# Patient Record
Sex: Male | Born: 1945
Health system: Southern US, Community
[De-identification: ages and names within clinical notes are randomized; demographics above are authoritative.]

## PROBLEM LIST (undated history)

## (undated) DIAGNOSIS — M1990 Unspecified osteoarthritis, unspecified site: Secondary | ICD-10-CM

## (undated) DIAGNOSIS — R0600 Dyspnea, unspecified: Secondary | ICD-10-CM

## (undated) DIAGNOSIS — I4891 Unspecified atrial fibrillation: Secondary | ICD-10-CM

## (undated) DIAGNOSIS — I509 Heart failure, unspecified: Secondary | ICD-10-CM

## (undated) DIAGNOSIS — M545 Low back pain, unspecified: Secondary | ICD-10-CM

## (undated) DIAGNOSIS — J449 Chronic obstructive pulmonary disease, unspecified: Secondary | ICD-10-CM

## (undated) DIAGNOSIS — G709 Myoneural disorder, unspecified: Secondary | ICD-10-CM

## (undated) DIAGNOSIS — I499 Cardiac arrhythmia, unspecified: Secondary | ICD-10-CM

## (undated) DIAGNOSIS — I2510 Atherosclerotic heart disease of native coronary artery without angina pectoris: Secondary | ICD-10-CM

## (undated) DIAGNOSIS — G4733 Obstructive sleep apnea (adult) (pediatric): Secondary | ICD-10-CM

## (undated) DIAGNOSIS — I10 Essential (primary) hypertension: Secondary | ICD-10-CM

## (undated) HISTORY — DX: Low back pain, unspecified: M54.50

## (undated) HISTORY — DX: Chronic obstructive pulmonary disease, unspecified: J44.9

## (undated) HISTORY — DX: Unspecified atrial fibrillation: I48.91

## (undated) HISTORY — DX: Essential (primary) hypertension: I10

## (undated) HISTORY — DX: Obstructive sleep apnea (adult) (pediatric): G47.33

## (undated) HISTORY — DX: Dyspnea, unspecified: R06.00

## (undated) HISTORY — PX: UMBILICAL HERNIA REPAIR: SHX196

## (undated) HISTORY — PX: OTHER SURGICAL HISTORY: SHX169

---
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
## 2015-09-04 DIAGNOSIS — J4520 Mild intermittent asthma, uncomplicated: Secondary | ICD-10-CM | POA: Diagnosis not present

## 2015-09-04 DIAGNOSIS — G4733 Obstructive sleep apnea (adult) (pediatric): Secondary | ICD-10-CM | POA: Diagnosis not present

## 2015-09-04 DIAGNOSIS — R5383 Other fatigue: Secondary | ICD-10-CM | POA: Diagnosis not present

## 2015-10-17 DIAGNOSIS — J4520 Mild intermittent asthma, uncomplicated: Secondary | ICD-10-CM | POA: Diagnosis not present

## 2015-10-17 DIAGNOSIS — G4733 Obstructive sleep apnea (adult) (pediatric): Secondary | ICD-10-CM | POA: Diagnosis not present

## 2015-10-17 DIAGNOSIS — R5383 Other fatigue: Secondary | ICD-10-CM | POA: Diagnosis not present

## 2015-10-18 DIAGNOSIS — G4733 Obstructive sleep apnea (adult) (pediatric): Secondary | ICD-10-CM | POA: Diagnosis not present

## 2015-10-22 DIAGNOSIS — D374 Neoplasm of uncertain behavior of colon: Secondary | ICD-10-CM | POA: Insufficient documentation

## 2015-10-26 DIAGNOSIS — G4733 Obstructive sleep apnea (adult) (pediatric): Secondary | ICD-10-CM | POA: Diagnosis not present

## 2015-10-26 DIAGNOSIS — R5383 Other fatigue: Secondary | ICD-10-CM | POA: Diagnosis not present

## 2015-10-26 DIAGNOSIS — J4520 Mild intermittent asthma, uncomplicated: Secondary | ICD-10-CM | POA: Diagnosis not present

## 2016-01-09 DIAGNOSIS — L918 Other hypertrophic disorders of the skin: Secondary | ICD-10-CM | POA: Diagnosis not present

## 2016-01-09 DIAGNOSIS — L02415 Cutaneous abscess of right lower limb: Secondary | ICD-10-CM | POA: Diagnosis not present

## 2017-01-28 DIAGNOSIS — D485 Neoplasm of uncertain behavior of skin: Secondary | ICD-10-CM | POA: Diagnosis not present

## 2017-01-28 DIAGNOSIS — L821 Other seborrheic keratosis: Secondary | ICD-10-CM | POA: Diagnosis not present

## 2017-01-28 DIAGNOSIS — L578 Other skin changes due to chronic exposure to nonionizing radiation: Secondary | ICD-10-CM | POA: Diagnosis not present

## 2017-02-05 DIAGNOSIS — R0602 Shortness of breath: Secondary | ICD-10-CM | POA: Diagnosis not present

## 2017-02-05 DIAGNOSIS — Z6837 Body mass index (BMI) 37.0-37.9, adult: Secondary | ICD-10-CM | POA: Diagnosis not present

## 2017-02-05 DIAGNOSIS — Z1389 Encounter for screening for other disorder: Secondary | ICD-10-CM | POA: Diagnosis not present

## 2017-02-05 DIAGNOSIS — E669 Obesity, unspecified: Secondary | ICD-10-CM | POA: Diagnosis not present

## 2017-02-05 DIAGNOSIS — I4891 Unspecified atrial fibrillation: Secondary | ICD-10-CM | POA: Diagnosis not present

## 2018-01-21 DIAGNOSIS — J449 Chronic obstructive pulmonary disease, unspecified: Secondary | ICD-10-CM | POA: Diagnosis not present

## 2018-01-21 DIAGNOSIS — H6121 Impacted cerumen, right ear: Secondary | ICD-10-CM | POA: Diagnosis not present

## 2018-01-21 DIAGNOSIS — Z6837 Body mass index (BMI) 37.0-37.9, adult: Secondary | ICD-10-CM | POA: Diagnosis not present

## 2018-01-21 DIAGNOSIS — E669 Obesity, unspecified: Secondary | ICD-10-CM | POA: Diagnosis not present

## 2018-02-05 ENCOUNTER — Other Ambulatory Visit: Payer: Self-pay | Admitting: Internal Medicine

## 2018-02-05 DIAGNOSIS — Z86010 Personal history of colonic polyps: Secondary | ICD-10-CM

## 2018-03-05 ENCOUNTER — Other Ambulatory Visit: Payer: Self-pay

## 2018-03-18 ENCOUNTER — Inpatient Hospital Stay
Admission: RE | Admit: 2018-03-18 | Discharge: 2018-03-18 | Disposition: A | Discharge status: Home / Self Care | Payer: Self-pay | Source: Ambulatory Visit | Attending: Internal Medicine | Admitting: Internal Medicine

## 2018-04-01 DIAGNOSIS — M9903 Segmental and somatic dysfunction of lumbar region: Secondary | ICD-10-CM | POA: Diagnosis not present

## 2018-04-01 DIAGNOSIS — S336XXA Sprain of sacroiliac joint, initial encounter: Secondary | ICD-10-CM | POA: Diagnosis not present

## 2018-04-01 DIAGNOSIS — M9902 Segmental and somatic dysfunction of thoracic region: Secondary | ICD-10-CM | POA: Diagnosis not present

## 2018-04-01 DIAGNOSIS — M9905 Segmental and somatic dysfunction of pelvic region: Secondary | ICD-10-CM | POA: Diagnosis not present

## 2018-04-01 DIAGNOSIS — M9901 Segmental and somatic dysfunction of cervical region: Secondary | ICD-10-CM | POA: Diagnosis not present

## 2018-04-01 DIAGNOSIS — M5383 Other specified dorsopathies, cervicothoracic region: Secondary | ICD-10-CM | POA: Diagnosis not present

## 2018-04-01 DIAGNOSIS — M4726 Other spondylosis with radiculopathy, lumbar region: Secondary | ICD-10-CM | POA: Diagnosis not present

## 2018-04-03 ENCOUNTER — Ambulatory Visit
Admission: RE | Admit: 2018-04-03 | Discharge: 2018-04-03 | Disposition: A | Discharge status: Home / Self Care | Payer: Non-veteran care | Source: Ambulatory Visit | Attending: Internal Medicine | Admitting: Internal Medicine

## 2018-04-03 DIAGNOSIS — Z86010 Personal history of colonic polyps: Secondary | ICD-10-CM

## 2018-04-07 DIAGNOSIS — M9903 Segmental and somatic dysfunction of lumbar region: Secondary | ICD-10-CM | POA: Diagnosis not present

## 2018-04-07 DIAGNOSIS — M9902 Segmental and somatic dysfunction of thoracic region: Secondary | ICD-10-CM | POA: Diagnosis not present

## 2018-04-07 DIAGNOSIS — S336XXA Sprain of sacroiliac joint, initial encounter: Secondary | ICD-10-CM | POA: Diagnosis not present

## 2018-04-07 DIAGNOSIS — M5383 Other specified dorsopathies, cervicothoracic region: Secondary | ICD-10-CM | POA: Diagnosis not present

## 2018-04-07 DIAGNOSIS — M4726 Other spondylosis with radiculopathy, lumbar region: Secondary | ICD-10-CM | POA: Diagnosis not present

## 2018-04-07 DIAGNOSIS — M9901 Segmental and somatic dysfunction of cervical region: Secondary | ICD-10-CM | POA: Diagnosis not present

## 2018-04-07 DIAGNOSIS — M9905 Segmental and somatic dysfunction of pelvic region: Secondary | ICD-10-CM | POA: Diagnosis not present

## 2018-04-09 DIAGNOSIS — M9903 Segmental and somatic dysfunction of lumbar region: Secondary | ICD-10-CM | POA: Diagnosis not present

## 2018-04-09 DIAGNOSIS — M5383 Other specified dorsopathies, cervicothoracic region: Secondary | ICD-10-CM | POA: Diagnosis not present

## 2018-04-09 DIAGNOSIS — M4726 Other spondylosis with radiculopathy, lumbar region: Secondary | ICD-10-CM | POA: Diagnosis not present

## 2018-04-09 DIAGNOSIS — M9901 Segmental and somatic dysfunction of cervical region: Secondary | ICD-10-CM | POA: Diagnosis not present

## 2018-04-09 DIAGNOSIS — M9905 Segmental and somatic dysfunction of pelvic region: Secondary | ICD-10-CM | POA: Diagnosis not present

## 2018-04-09 DIAGNOSIS — S336XXA Sprain of sacroiliac joint, initial encounter: Secondary | ICD-10-CM | POA: Diagnosis not present

## 2018-04-09 DIAGNOSIS — M9902 Segmental and somatic dysfunction of thoracic region: Secondary | ICD-10-CM | POA: Diagnosis not present

## 2018-04-13 DIAGNOSIS — S336XXA Sprain of sacroiliac joint, initial encounter: Secondary | ICD-10-CM | POA: Diagnosis not present

## 2018-04-13 DIAGNOSIS — M9903 Segmental and somatic dysfunction of lumbar region: Secondary | ICD-10-CM | POA: Diagnosis not present

## 2018-04-13 DIAGNOSIS — M5383 Other specified dorsopathies, cervicothoracic region: Secondary | ICD-10-CM | POA: Diagnosis not present

## 2018-04-13 DIAGNOSIS — M9905 Segmental and somatic dysfunction of pelvic region: Secondary | ICD-10-CM | POA: Diagnosis not present

## 2018-04-13 DIAGNOSIS — M4726 Other spondylosis with radiculopathy, lumbar region: Secondary | ICD-10-CM | POA: Diagnosis not present

## 2018-04-13 DIAGNOSIS — M9902 Segmental and somatic dysfunction of thoracic region: Secondary | ICD-10-CM | POA: Diagnosis not present

## 2018-04-13 DIAGNOSIS — M9901 Segmental and somatic dysfunction of cervical region: Secondary | ICD-10-CM | POA: Diagnosis not present

## 2018-04-21 DIAGNOSIS — M9902 Segmental and somatic dysfunction of thoracic region: Secondary | ICD-10-CM | POA: Diagnosis not present

## 2018-04-21 DIAGNOSIS — M9905 Segmental and somatic dysfunction of pelvic region: Secondary | ICD-10-CM | POA: Diagnosis not present

## 2018-04-21 DIAGNOSIS — S336XXA Sprain of sacroiliac joint, initial encounter: Secondary | ICD-10-CM | POA: Diagnosis not present

## 2018-04-21 DIAGNOSIS — M9901 Segmental and somatic dysfunction of cervical region: Secondary | ICD-10-CM | POA: Diagnosis not present

## 2018-04-21 DIAGNOSIS — M4726 Other spondylosis with radiculopathy, lumbar region: Secondary | ICD-10-CM | POA: Diagnosis not present

## 2018-04-21 DIAGNOSIS — M9903 Segmental and somatic dysfunction of lumbar region: Secondary | ICD-10-CM | POA: Diagnosis not present

## 2018-04-21 DIAGNOSIS — M5383 Other specified dorsopathies, cervicothoracic region: Secondary | ICD-10-CM | POA: Diagnosis not present

## 2018-04-30 DIAGNOSIS — M9905 Segmental and somatic dysfunction of pelvic region: Secondary | ICD-10-CM | POA: Diagnosis not present

## 2018-04-30 DIAGNOSIS — M9901 Segmental and somatic dysfunction of cervical region: Secondary | ICD-10-CM | POA: Diagnosis not present

## 2018-04-30 DIAGNOSIS — M5383 Other specified dorsopathies, cervicothoracic region: Secondary | ICD-10-CM | POA: Diagnosis not present

## 2018-04-30 DIAGNOSIS — S336XXA Sprain of sacroiliac joint, initial encounter: Secondary | ICD-10-CM | POA: Diagnosis not present

## 2018-04-30 DIAGNOSIS — M4726 Other spondylosis with radiculopathy, lumbar region: Secondary | ICD-10-CM | POA: Diagnosis not present

## 2018-04-30 DIAGNOSIS — M9903 Segmental and somatic dysfunction of lumbar region: Secondary | ICD-10-CM | POA: Diagnosis not present

## 2018-04-30 DIAGNOSIS — M9902 Segmental and somatic dysfunction of thoracic region: Secondary | ICD-10-CM | POA: Diagnosis not present

## 2018-05-05 DIAGNOSIS — M9905 Segmental and somatic dysfunction of pelvic region: Secondary | ICD-10-CM | POA: Diagnosis not present

## 2018-05-05 DIAGNOSIS — S336XXA Sprain of sacroiliac joint, initial encounter: Secondary | ICD-10-CM | POA: Diagnosis not present

## 2018-05-05 DIAGNOSIS — M9902 Segmental and somatic dysfunction of thoracic region: Secondary | ICD-10-CM | POA: Diagnosis not present

## 2018-05-05 DIAGNOSIS — M5383 Other specified dorsopathies, cervicothoracic region: Secondary | ICD-10-CM | POA: Diagnosis not present

## 2018-05-05 DIAGNOSIS — M4726 Other spondylosis with radiculopathy, lumbar region: Secondary | ICD-10-CM | POA: Diagnosis not present

## 2018-05-05 DIAGNOSIS — M9903 Segmental and somatic dysfunction of lumbar region: Secondary | ICD-10-CM | POA: Diagnosis not present

## 2018-05-05 DIAGNOSIS — M9901 Segmental and somatic dysfunction of cervical region: Secondary | ICD-10-CM | POA: Diagnosis not present

## 2018-05-13 DIAGNOSIS — M9905 Segmental and somatic dysfunction of pelvic region: Secondary | ICD-10-CM | POA: Diagnosis not present

## 2018-05-13 DIAGNOSIS — M4726 Other spondylosis with radiculopathy, lumbar region: Secondary | ICD-10-CM | POA: Diagnosis not present

## 2018-05-13 DIAGNOSIS — M9901 Segmental and somatic dysfunction of cervical region: Secondary | ICD-10-CM | POA: Diagnosis not present

## 2018-05-13 DIAGNOSIS — S336XXA Sprain of sacroiliac joint, initial encounter: Secondary | ICD-10-CM | POA: Diagnosis not present

## 2018-05-13 DIAGNOSIS — M5383 Other specified dorsopathies, cervicothoracic region: Secondary | ICD-10-CM | POA: Diagnosis not present

## 2018-05-13 DIAGNOSIS — M9902 Segmental and somatic dysfunction of thoracic region: Secondary | ICD-10-CM | POA: Diagnosis not present

## 2018-05-13 DIAGNOSIS — M9903 Segmental and somatic dysfunction of lumbar region: Secondary | ICD-10-CM | POA: Diagnosis not present

## 2018-05-27 DIAGNOSIS — M9903 Segmental and somatic dysfunction of lumbar region: Secondary | ICD-10-CM | POA: Diagnosis not present

## 2018-05-27 DIAGNOSIS — M9901 Segmental and somatic dysfunction of cervical region: Secondary | ICD-10-CM | POA: Diagnosis not present

## 2018-05-27 DIAGNOSIS — S336XXA Sprain of sacroiliac joint, initial encounter: Secondary | ICD-10-CM | POA: Diagnosis not present

## 2018-05-27 DIAGNOSIS — M9902 Segmental and somatic dysfunction of thoracic region: Secondary | ICD-10-CM | POA: Diagnosis not present

## 2018-05-27 DIAGNOSIS — M4726 Other spondylosis with radiculopathy, lumbar region: Secondary | ICD-10-CM | POA: Diagnosis not present

## 2018-05-27 DIAGNOSIS — M5383 Other specified dorsopathies, cervicothoracic region: Secondary | ICD-10-CM | POA: Diagnosis not present

## 2018-05-27 DIAGNOSIS — M9905 Segmental and somatic dysfunction of pelvic region: Secondary | ICD-10-CM | POA: Diagnosis not present

## 2018-06-18 DIAGNOSIS — M9903 Segmental and somatic dysfunction of lumbar region: Secondary | ICD-10-CM | POA: Diagnosis not present

## 2018-06-18 DIAGNOSIS — M5383 Other specified dorsopathies, cervicothoracic region: Secondary | ICD-10-CM | POA: Diagnosis not present

## 2018-06-18 DIAGNOSIS — M9905 Segmental and somatic dysfunction of pelvic region: Secondary | ICD-10-CM | POA: Diagnosis not present

## 2018-06-18 DIAGNOSIS — M9901 Segmental and somatic dysfunction of cervical region: Secondary | ICD-10-CM | POA: Diagnosis not present

## 2018-06-18 DIAGNOSIS — S336XXA Sprain of sacroiliac joint, initial encounter: Secondary | ICD-10-CM | POA: Diagnosis not present

## 2018-06-18 DIAGNOSIS — M4726 Other spondylosis with radiculopathy, lumbar region: Secondary | ICD-10-CM | POA: Diagnosis not present

## 2018-06-18 DIAGNOSIS — M9902 Segmental and somatic dysfunction of thoracic region: Secondary | ICD-10-CM | POA: Diagnosis not present

## 2018-06-25 DIAGNOSIS — M5383 Other specified dorsopathies, cervicothoracic region: Secondary | ICD-10-CM | POA: Diagnosis not present

## 2018-06-25 DIAGNOSIS — M4726 Other spondylosis with radiculopathy, lumbar region: Secondary | ICD-10-CM | POA: Diagnosis not present

## 2018-06-25 DIAGNOSIS — M9901 Segmental and somatic dysfunction of cervical region: Secondary | ICD-10-CM | POA: Diagnosis not present

## 2018-06-25 DIAGNOSIS — M9905 Segmental and somatic dysfunction of pelvic region: Secondary | ICD-10-CM | POA: Diagnosis not present

## 2018-06-25 DIAGNOSIS — M9902 Segmental and somatic dysfunction of thoracic region: Secondary | ICD-10-CM | POA: Diagnosis not present

## 2018-06-25 DIAGNOSIS — M9903 Segmental and somatic dysfunction of lumbar region: Secondary | ICD-10-CM | POA: Diagnosis not present

## 2018-06-25 DIAGNOSIS — S336XXA Sprain of sacroiliac joint, initial encounter: Secondary | ICD-10-CM | POA: Diagnosis not present

## 2018-07-01 DIAGNOSIS — M9901 Segmental and somatic dysfunction of cervical region: Secondary | ICD-10-CM | POA: Diagnosis not present

## 2018-07-01 DIAGNOSIS — M4726 Other spondylosis with radiculopathy, lumbar region: Secondary | ICD-10-CM | POA: Diagnosis not present

## 2018-07-01 DIAGNOSIS — M9903 Segmental and somatic dysfunction of lumbar region: Secondary | ICD-10-CM | POA: Diagnosis not present

## 2018-07-01 DIAGNOSIS — S336XXA Sprain of sacroiliac joint, initial encounter: Secondary | ICD-10-CM | POA: Diagnosis not present

## 2018-07-01 DIAGNOSIS — M5383 Other specified dorsopathies, cervicothoracic region: Secondary | ICD-10-CM | POA: Diagnosis not present

## 2018-07-01 DIAGNOSIS — M9902 Segmental and somatic dysfunction of thoracic region: Secondary | ICD-10-CM | POA: Diagnosis not present

## 2018-07-01 DIAGNOSIS — M9905 Segmental and somatic dysfunction of pelvic region: Secondary | ICD-10-CM | POA: Diagnosis not present

## 2018-07-16 DIAGNOSIS — S336XXA Sprain of sacroiliac joint, initial encounter: Secondary | ICD-10-CM | POA: Diagnosis not present

## 2018-07-16 DIAGNOSIS — M9901 Segmental and somatic dysfunction of cervical region: Secondary | ICD-10-CM | POA: Diagnosis not present

## 2018-07-16 DIAGNOSIS — M9905 Segmental and somatic dysfunction of pelvic region: Secondary | ICD-10-CM | POA: Diagnosis not present

## 2018-07-16 DIAGNOSIS — M9902 Segmental and somatic dysfunction of thoracic region: Secondary | ICD-10-CM | POA: Diagnosis not present

## 2018-07-16 DIAGNOSIS — M5383 Other specified dorsopathies, cervicothoracic region: Secondary | ICD-10-CM | POA: Diagnosis not present

## 2018-07-16 DIAGNOSIS — M9903 Segmental and somatic dysfunction of lumbar region: Secondary | ICD-10-CM | POA: Diagnosis not present

## 2018-07-16 DIAGNOSIS — M4726 Other spondylosis with radiculopathy, lumbar region: Secondary | ICD-10-CM | POA: Diagnosis not present

## 2018-07-20 DIAGNOSIS — Z6835 Body mass index (BMI) 35.0-35.9, adult: Secondary | ICD-10-CM | POA: Diagnosis not present

## 2018-07-20 DIAGNOSIS — J449 Chronic obstructive pulmonary disease, unspecified: Secondary | ICD-10-CM | POA: Diagnosis not present

## 2018-07-20 DIAGNOSIS — J069 Acute upper respiratory infection, unspecified: Secondary | ICD-10-CM | POA: Diagnosis not present

## 2018-07-29 DIAGNOSIS — M9901 Segmental and somatic dysfunction of cervical region: Secondary | ICD-10-CM | POA: Diagnosis not present

## 2018-07-29 DIAGNOSIS — M4726 Other spondylosis with radiculopathy, lumbar region: Secondary | ICD-10-CM | POA: Diagnosis not present

## 2018-07-29 DIAGNOSIS — M9903 Segmental and somatic dysfunction of lumbar region: Secondary | ICD-10-CM | POA: Diagnosis not present

## 2018-07-29 DIAGNOSIS — S336XXA Sprain of sacroiliac joint, initial encounter: Secondary | ICD-10-CM | POA: Diagnosis not present

## 2018-07-29 DIAGNOSIS — M9902 Segmental and somatic dysfunction of thoracic region: Secondary | ICD-10-CM | POA: Diagnosis not present

## 2018-07-29 DIAGNOSIS — M5383 Other specified dorsopathies, cervicothoracic region: Secondary | ICD-10-CM | POA: Diagnosis not present

## 2018-07-29 DIAGNOSIS — M9905 Segmental and somatic dysfunction of pelvic region: Secondary | ICD-10-CM | POA: Diagnosis not present

## 2018-08-13 DIAGNOSIS — M9905 Segmental and somatic dysfunction of pelvic region: Secondary | ICD-10-CM | POA: Diagnosis not present

## 2018-08-13 DIAGNOSIS — M9903 Segmental and somatic dysfunction of lumbar region: Secondary | ICD-10-CM | POA: Diagnosis not present

## 2018-08-13 DIAGNOSIS — M9901 Segmental and somatic dysfunction of cervical region: Secondary | ICD-10-CM | POA: Diagnosis not present

## 2018-08-13 DIAGNOSIS — S336XXA Sprain of sacroiliac joint, initial encounter: Secondary | ICD-10-CM | POA: Diagnosis not present

## 2018-08-13 DIAGNOSIS — M4726 Other spondylosis with radiculopathy, lumbar region: Secondary | ICD-10-CM | POA: Diagnosis not present

## 2018-08-13 DIAGNOSIS — M9902 Segmental and somatic dysfunction of thoracic region: Secondary | ICD-10-CM | POA: Diagnosis not present

## 2018-08-13 DIAGNOSIS — M5383 Other specified dorsopathies, cervicothoracic region: Secondary | ICD-10-CM | POA: Diagnosis not present

## 2018-08-26 DIAGNOSIS — M4726 Other spondylosis with radiculopathy, lumbar region: Secondary | ICD-10-CM | POA: Diagnosis not present

## 2018-08-26 DIAGNOSIS — M9905 Segmental and somatic dysfunction of pelvic region: Secondary | ICD-10-CM | POA: Diagnosis not present

## 2018-08-26 DIAGNOSIS — M5383 Other specified dorsopathies, cervicothoracic region: Secondary | ICD-10-CM | POA: Diagnosis not present

## 2018-08-26 DIAGNOSIS — S336XXA Sprain of sacroiliac joint, initial encounter: Secondary | ICD-10-CM | POA: Diagnosis not present

## 2018-08-26 DIAGNOSIS — M9903 Segmental and somatic dysfunction of lumbar region: Secondary | ICD-10-CM | POA: Diagnosis not present

## 2018-08-26 DIAGNOSIS — M9901 Segmental and somatic dysfunction of cervical region: Secondary | ICD-10-CM | POA: Diagnosis not present

## 2018-08-26 DIAGNOSIS — M9902 Segmental and somatic dysfunction of thoracic region: Secondary | ICD-10-CM | POA: Diagnosis not present

## 2018-09-09 DIAGNOSIS — M9901 Segmental and somatic dysfunction of cervical region: Secondary | ICD-10-CM | POA: Diagnosis not present

## 2018-09-09 DIAGNOSIS — M4726 Other spondylosis with radiculopathy, lumbar region: Secondary | ICD-10-CM | POA: Diagnosis not present

## 2018-09-09 DIAGNOSIS — M5383 Other specified dorsopathies, cervicothoracic region: Secondary | ICD-10-CM | POA: Diagnosis not present

## 2018-09-09 DIAGNOSIS — M9902 Segmental and somatic dysfunction of thoracic region: Secondary | ICD-10-CM | POA: Diagnosis not present

## 2018-09-09 DIAGNOSIS — M9905 Segmental and somatic dysfunction of pelvic region: Secondary | ICD-10-CM | POA: Diagnosis not present

## 2018-09-09 DIAGNOSIS — M9903 Segmental and somatic dysfunction of lumbar region: Secondary | ICD-10-CM | POA: Diagnosis not present

## 2018-09-09 DIAGNOSIS — S336XXA Sprain of sacroiliac joint, initial encounter: Secondary | ICD-10-CM | POA: Diagnosis not present

## 2018-09-30 DIAGNOSIS — M9902 Segmental and somatic dysfunction of thoracic region: Secondary | ICD-10-CM | POA: Diagnosis not present

## 2018-09-30 DIAGNOSIS — M9905 Segmental and somatic dysfunction of pelvic region: Secondary | ICD-10-CM | POA: Diagnosis not present

## 2018-09-30 DIAGNOSIS — M9901 Segmental and somatic dysfunction of cervical region: Secondary | ICD-10-CM | POA: Diagnosis not present

## 2018-09-30 DIAGNOSIS — S336XXA Sprain of sacroiliac joint, initial encounter: Secondary | ICD-10-CM | POA: Diagnosis not present

## 2018-09-30 DIAGNOSIS — M5383 Other specified dorsopathies, cervicothoracic region: Secondary | ICD-10-CM | POA: Diagnosis not present

## 2018-09-30 DIAGNOSIS — M9903 Segmental and somatic dysfunction of lumbar region: Secondary | ICD-10-CM | POA: Diagnosis not present

## 2018-09-30 DIAGNOSIS — M4726 Other spondylosis with radiculopathy, lumbar region: Secondary | ICD-10-CM | POA: Diagnosis not present

## 2018-10-22 DIAGNOSIS — M9901 Segmental and somatic dysfunction of cervical region: Secondary | ICD-10-CM | POA: Diagnosis not present

## 2018-10-22 DIAGNOSIS — M5383 Other specified dorsopathies, cervicothoracic region: Secondary | ICD-10-CM | POA: Diagnosis not present

## 2018-10-22 DIAGNOSIS — S336XXA Sprain of sacroiliac joint, initial encounter: Secondary | ICD-10-CM | POA: Diagnosis not present

## 2018-10-22 DIAGNOSIS — M9903 Segmental and somatic dysfunction of lumbar region: Secondary | ICD-10-CM | POA: Diagnosis not present

## 2018-10-22 DIAGNOSIS — M9905 Segmental and somatic dysfunction of pelvic region: Secondary | ICD-10-CM | POA: Diagnosis not present

## 2018-10-22 DIAGNOSIS — M9902 Segmental and somatic dysfunction of thoracic region: Secondary | ICD-10-CM | POA: Diagnosis not present

## 2018-10-22 DIAGNOSIS — M4726 Other spondylosis with radiculopathy, lumbar region: Secondary | ICD-10-CM | POA: Diagnosis not present

## 2018-11-11 DIAGNOSIS — M4726 Other spondylosis with radiculopathy, lumbar region: Secondary | ICD-10-CM | POA: Diagnosis not present

## 2018-11-11 DIAGNOSIS — M5383 Other specified dorsopathies, cervicothoracic region: Secondary | ICD-10-CM | POA: Diagnosis not present

## 2018-11-11 DIAGNOSIS — M9901 Segmental and somatic dysfunction of cervical region: Secondary | ICD-10-CM | POA: Diagnosis not present

## 2018-11-11 DIAGNOSIS — M9902 Segmental and somatic dysfunction of thoracic region: Secondary | ICD-10-CM | POA: Diagnosis not present

## 2018-11-11 DIAGNOSIS — S336XXA Sprain of sacroiliac joint, initial encounter: Secondary | ICD-10-CM | POA: Diagnosis not present

## 2018-11-11 DIAGNOSIS — M9905 Segmental and somatic dysfunction of pelvic region: Secondary | ICD-10-CM | POA: Diagnosis not present

## 2018-11-11 DIAGNOSIS — M9903 Segmental and somatic dysfunction of lumbar region: Secondary | ICD-10-CM | POA: Diagnosis not present

## 2018-11-30 DIAGNOSIS — M9902 Segmental and somatic dysfunction of thoracic region: Secondary | ICD-10-CM | POA: Diagnosis not present

## 2018-11-30 DIAGNOSIS — M9901 Segmental and somatic dysfunction of cervical region: Secondary | ICD-10-CM | POA: Diagnosis not present

## 2018-11-30 DIAGNOSIS — M9903 Segmental and somatic dysfunction of lumbar region: Secondary | ICD-10-CM | POA: Diagnosis not present

## 2018-11-30 DIAGNOSIS — M5383 Other specified dorsopathies, cervicothoracic region: Secondary | ICD-10-CM | POA: Diagnosis not present

## 2018-11-30 DIAGNOSIS — M4726 Other spondylosis with radiculopathy, lumbar region: Secondary | ICD-10-CM | POA: Diagnosis not present

## 2018-11-30 DIAGNOSIS — M9905 Segmental and somatic dysfunction of pelvic region: Secondary | ICD-10-CM | POA: Diagnosis not present

## 2018-11-30 DIAGNOSIS — S336XXA Sprain of sacroiliac joint, initial encounter: Secondary | ICD-10-CM | POA: Diagnosis not present

## 2018-12-23 DIAGNOSIS — S336XXA Sprain of sacroiliac joint, initial encounter: Secondary | ICD-10-CM | POA: Diagnosis not present

## 2018-12-23 DIAGNOSIS — M9901 Segmental and somatic dysfunction of cervical region: Secondary | ICD-10-CM | POA: Diagnosis not present

## 2018-12-23 DIAGNOSIS — M4726 Other spondylosis with radiculopathy, lumbar region: Secondary | ICD-10-CM | POA: Diagnosis not present

## 2018-12-23 DIAGNOSIS — M9903 Segmental and somatic dysfunction of lumbar region: Secondary | ICD-10-CM | POA: Diagnosis not present

## 2018-12-23 DIAGNOSIS — M9905 Segmental and somatic dysfunction of pelvic region: Secondary | ICD-10-CM | POA: Diagnosis not present

## 2018-12-23 DIAGNOSIS — M9902 Segmental and somatic dysfunction of thoracic region: Secondary | ICD-10-CM | POA: Diagnosis not present

## 2018-12-23 DIAGNOSIS — M5383 Other specified dorsopathies, cervicothoracic region: Secondary | ICD-10-CM | POA: Diagnosis not present

## 2019-02-03 DIAGNOSIS — M9903 Segmental and somatic dysfunction of lumbar region: Secondary | ICD-10-CM | POA: Diagnosis not present

## 2019-02-03 DIAGNOSIS — M9902 Segmental and somatic dysfunction of thoracic region: Secondary | ICD-10-CM | POA: Diagnosis not present

## 2019-02-03 DIAGNOSIS — M9905 Segmental and somatic dysfunction of pelvic region: Secondary | ICD-10-CM | POA: Diagnosis not present

## 2019-02-03 DIAGNOSIS — M5383 Other specified dorsopathies, cervicothoracic region: Secondary | ICD-10-CM | POA: Diagnosis not present

## 2019-02-03 DIAGNOSIS — S336XXA Sprain of sacroiliac joint, initial encounter: Secondary | ICD-10-CM | POA: Diagnosis not present

## 2019-02-03 DIAGNOSIS — M4726 Other spondylosis with radiculopathy, lumbar region: Secondary | ICD-10-CM | POA: Diagnosis not present

## 2019-02-03 DIAGNOSIS — M9901 Segmental and somatic dysfunction of cervical region: Secondary | ICD-10-CM | POA: Diagnosis not present

## 2019-02-11 DIAGNOSIS — M9902 Segmental and somatic dysfunction of thoracic region: Secondary | ICD-10-CM | POA: Diagnosis not present

## 2019-02-11 DIAGNOSIS — M4726 Other spondylosis with radiculopathy, lumbar region: Secondary | ICD-10-CM | POA: Diagnosis not present

## 2019-02-11 DIAGNOSIS — M5383 Other specified dorsopathies, cervicothoracic region: Secondary | ICD-10-CM | POA: Diagnosis not present

## 2019-02-11 DIAGNOSIS — M9903 Segmental and somatic dysfunction of lumbar region: Secondary | ICD-10-CM | POA: Diagnosis not present

## 2019-02-11 DIAGNOSIS — S336XXA Sprain of sacroiliac joint, initial encounter: Secondary | ICD-10-CM | POA: Diagnosis not present

## 2019-02-11 DIAGNOSIS — M9905 Segmental and somatic dysfunction of pelvic region: Secondary | ICD-10-CM | POA: Diagnosis not present

## 2019-02-11 DIAGNOSIS — M9901 Segmental and somatic dysfunction of cervical region: Secondary | ICD-10-CM | POA: Diagnosis not present

## 2019-02-12 IMAGING — CT CT VIRTUAL COLONOSCOPY DIAGNOSTIC
2 of 10 series · 12 of 46 positions shown, 18 images · non-contrast
Comparison: None.

CLINICAL DATA: Unsuccessful colonoscopy

EXAM:
CT VIRTUAL COLONOSCOPY DIAGNOSTIC
TECHNIQUE: The patient was given a standard bowel preparation with Gastrografin
and barium for fluid and stool tagging respectively. The quality of
the bowel preparation is poor. Automated CO2 insufflation of the
colon was performed prior to image acquisition and colonic
distention is poor. Image post processing was used to generate a 3D
endoluminal fly-through projection of the colon and to
electronically subtract stool/fluid as appropriate.

[Series 4: supine colon 1.50 br40 s3 supine thins · axial · 0.96mm/px · z∈[+1112,+1485]mm · 9 of 467 slices shown, 15 images]
[im 47/467  soft-tissue]
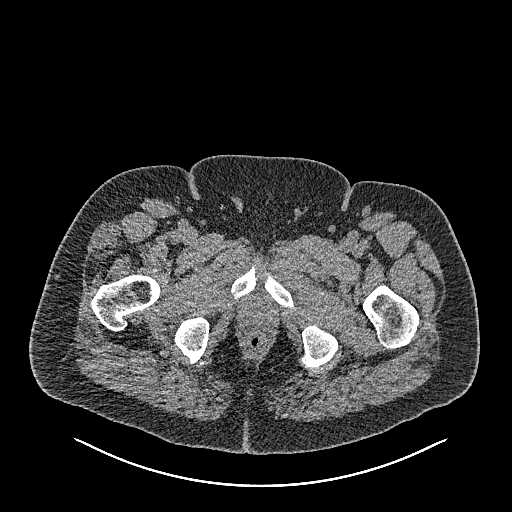
[im 47/467  bone]
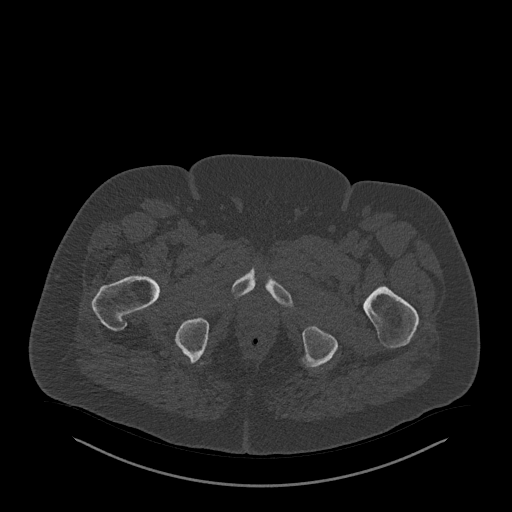
[im 94/467  soft-tissue]
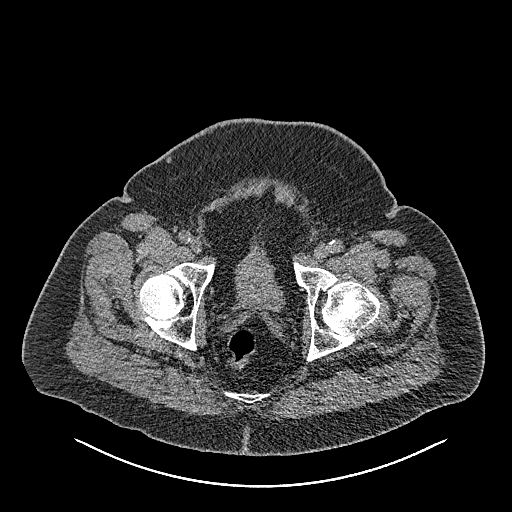
[im 140/467  soft-tissue]
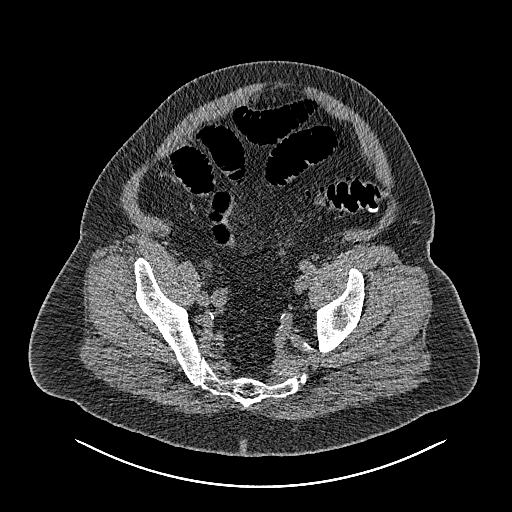
[im 187/467  soft-tissue]
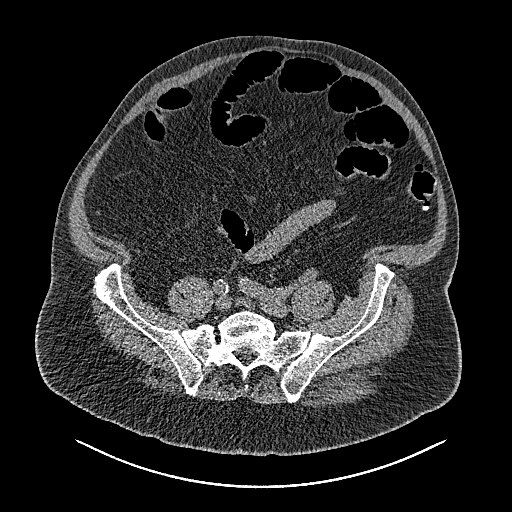
[im 234/467  soft-tissue]
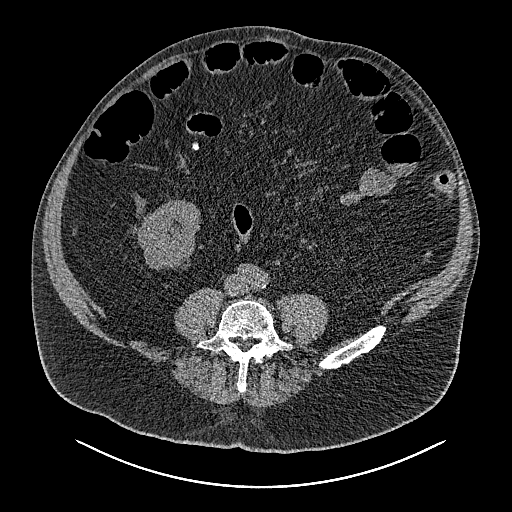
[im 280/467  soft-tissue]
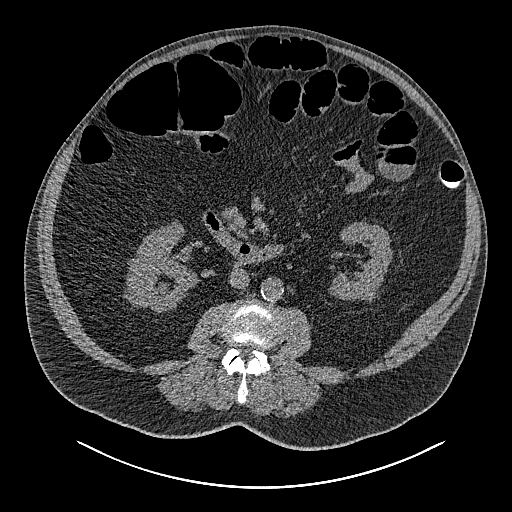
[im 280/467  lung]
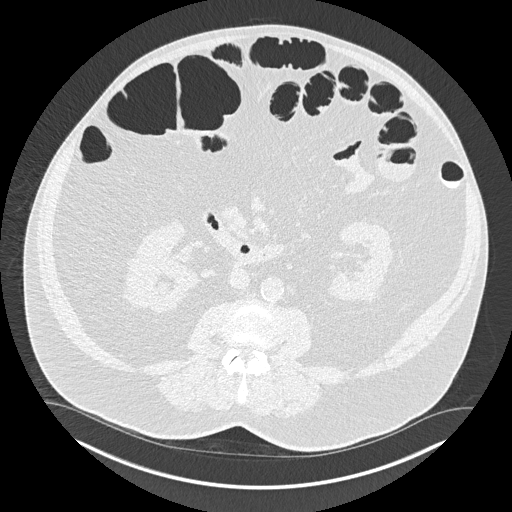
[im 327/467  soft-tissue]
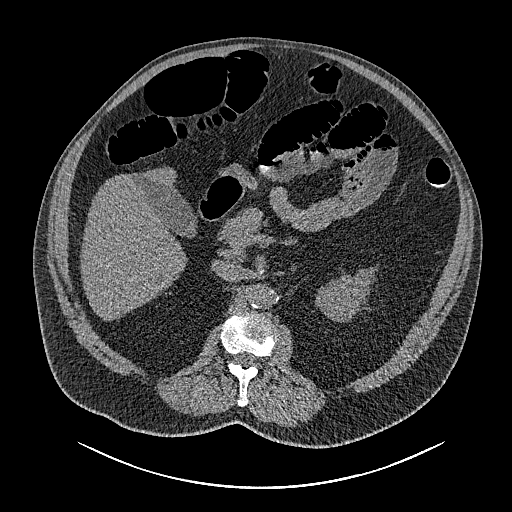
[im 327/467  lung]
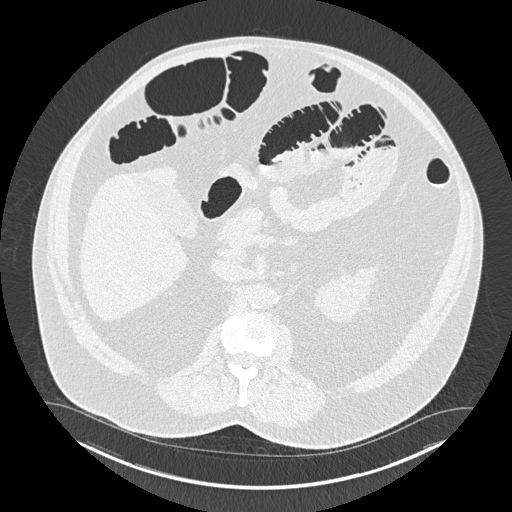
[im 373/467  soft-tissue]
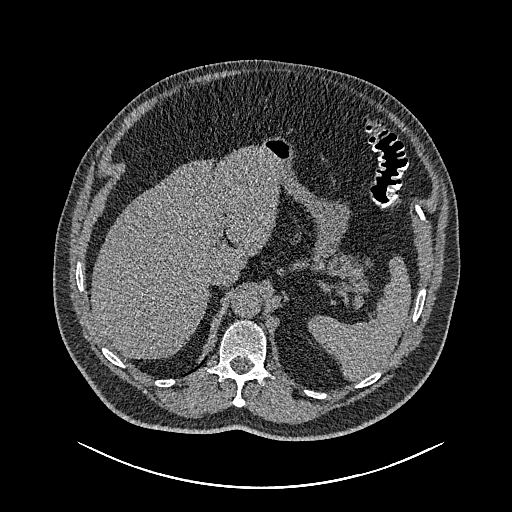
[im 373/467  lung]
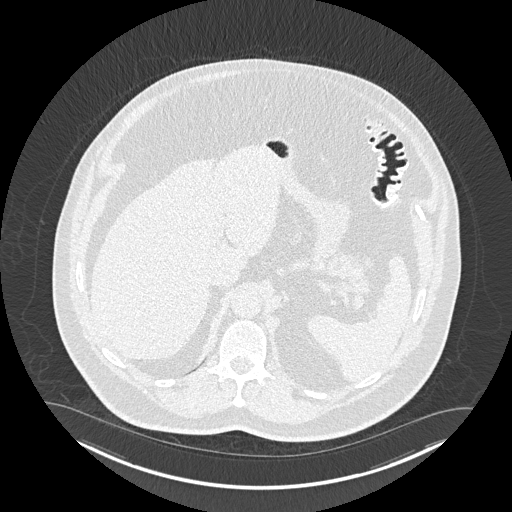
[im 420/467  soft-tissue]
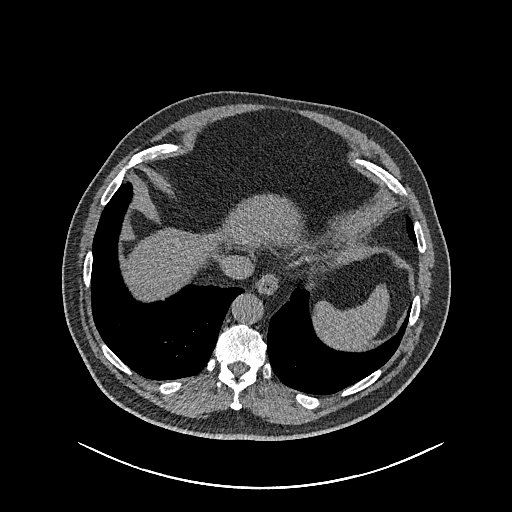
[im 420/467  lung]
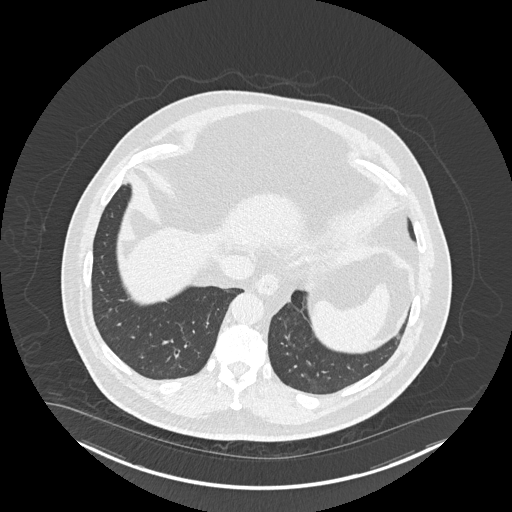
[im 420/467  bone]
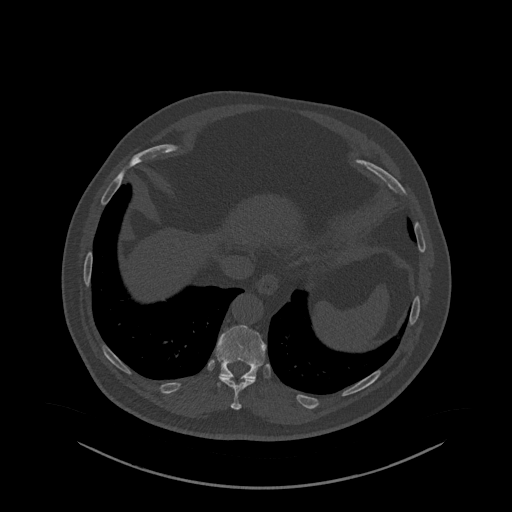

[Series 6: supine colon 3.00 br40 s3 cor cor supine · coronal · 0.87mm/px · 3 of 150 slices shown]
[im 38/150  soft-tissue]
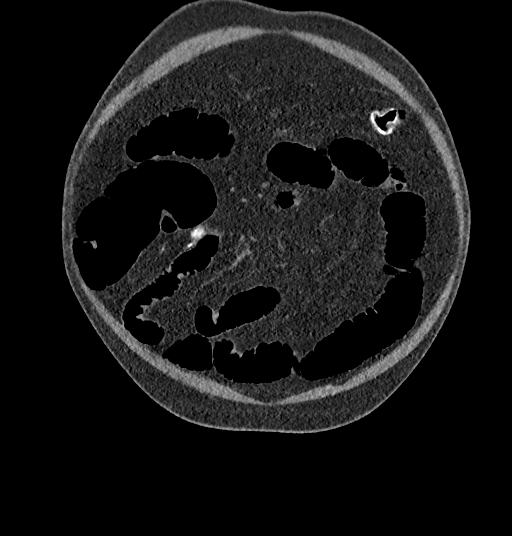
[im 75/150  soft-tissue]
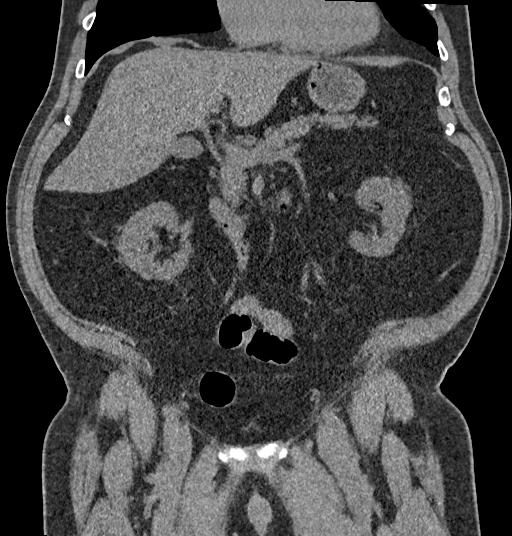
[im 112/150  soft-tissue]
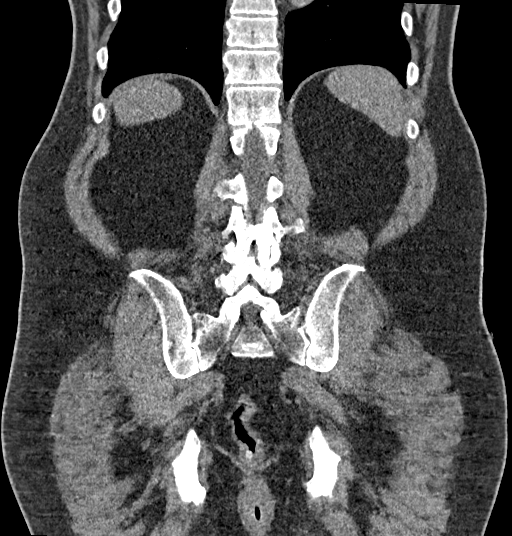

[12 of 46 positions shown; findings below may reference images not displayed]

FINDINGS: VIRTUAL COLONOSCOPY

There is poor distention of the colon diffusely on both supine and
decubitus imaging. It is difficult to exclude small polypoid
lesions. No visible annular constricting lesions. There appears to
be decompression of the colon through the ileocecal valve with
moderate gaseous distention of small bowel. Moderate diverticular
disease noted in the descending colon and sigmoid colon.

Virtual colonoscopy is not designed to detect diminutive polyps
(i.e., less than or equal to 5 mm), the presence or absence of which
may not affect clinical management.

CT ABDOMEN AND PELVIS WITHOUT CONTRAST

Lower chest: Lung bases are clear. No effusions. Heart is normal
size.

Hepatobiliary: No focal hepatic abnormality. Gallbladder
unremarkable.

Pancreas: No focal abnormality or ductal dilatation.

Spleen: No focal abnormality.  Normal size.

Adrenals/Urinary Tract: 7 mm nonobstructing stone in the lower pole
of the right kidney. No hydronephrosis. Adrenal glands and urinary
bladder unremarkable.

Stomach/Bowel: No visible small bowel or stomach abnormality.

Vascular/Lymphatic: Aortic atherosclerosis. No enlarged abdominal or
pelvic lymph nodes.

Reproductive: No visible focal abnormality.

Other: No free fluid or free air.

Musculoskeletal: No acute bony abnormality.
IMPRESSION: Suboptimal study with insufficient distention of the colon, possibly
due to decompression through the ileocecal valve into small bowel.

Moderate left colonic diverticulosis.

No definite annular constricting lesion. It is difficult to exclude
small polypoid lesions due to under distention.

Right lower pole nephrolithiasis.

Aortic atherosclerosis.

## 2019-03-03 DIAGNOSIS — M9901 Segmental and somatic dysfunction of cervical region: Secondary | ICD-10-CM | POA: Diagnosis not present

## 2019-03-03 DIAGNOSIS — M9905 Segmental and somatic dysfunction of pelvic region: Secondary | ICD-10-CM | POA: Diagnosis not present

## 2019-03-03 DIAGNOSIS — M9902 Segmental and somatic dysfunction of thoracic region: Secondary | ICD-10-CM | POA: Diagnosis not present

## 2019-03-03 DIAGNOSIS — M5383 Other specified dorsopathies, cervicothoracic region: Secondary | ICD-10-CM | POA: Diagnosis not present

## 2019-03-03 DIAGNOSIS — M4726 Other spondylosis with radiculopathy, lumbar region: Secondary | ICD-10-CM | POA: Diagnosis not present

## 2019-03-03 DIAGNOSIS — M9903 Segmental and somatic dysfunction of lumbar region: Secondary | ICD-10-CM | POA: Diagnosis not present

## 2019-03-03 DIAGNOSIS — S336XXA Sprain of sacroiliac joint, initial encounter: Secondary | ICD-10-CM | POA: Diagnosis not present

## 2019-03-31 DIAGNOSIS — M9902 Segmental and somatic dysfunction of thoracic region: Secondary | ICD-10-CM | POA: Diagnosis not present

## 2019-03-31 DIAGNOSIS — M4726 Other spondylosis with radiculopathy, lumbar region: Secondary | ICD-10-CM | POA: Diagnosis not present

## 2019-03-31 DIAGNOSIS — M9903 Segmental and somatic dysfunction of lumbar region: Secondary | ICD-10-CM | POA: Diagnosis not present

## 2019-03-31 DIAGNOSIS — M9905 Segmental and somatic dysfunction of pelvic region: Secondary | ICD-10-CM | POA: Diagnosis not present

## 2019-03-31 DIAGNOSIS — S336XXA Sprain of sacroiliac joint, initial encounter: Secondary | ICD-10-CM | POA: Diagnosis not present

## 2019-03-31 DIAGNOSIS — M5383 Other specified dorsopathies, cervicothoracic region: Secondary | ICD-10-CM | POA: Diagnosis not present

## 2019-03-31 DIAGNOSIS — M9901 Segmental and somatic dysfunction of cervical region: Secondary | ICD-10-CM | POA: Diagnosis not present

## 2019-04-28 DIAGNOSIS — M9902 Segmental and somatic dysfunction of thoracic region: Secondary | ICD-10-CM | POA: Diagnosis not present

## 2019-04-28 DIAGNOSIS — M4726 Other spondylosis with radiculopathy, lumbar region: Secondary | ICD-10-CM | POA: Diagnosis not present

## 2019-04-28 DIAGNOSIS — M9903 Segmental and somatic dysfunction of lumbar region: Secondary | ICD-10-CM | POA: Diagnosis not present

## 2019-04-28 DIAGNOSIS — M9905 Segmental and somatic dysfunction of pelvic region: Secondary | ICD-10-CM | POA: Diagnosis not present

## 2019-04-28 DIAGNOSIS — S336XXA Sprain of sacroiliac joint, initial encounter: Secondary | ICD-10-CM | POA: Diagnosis not present

## 2019-04-28 DIAGNOSIS — M9901 Segmental and somatic dysfunction of cervical region: Secondary | ICD-10-CM | POA: Diagnosis not present

## 2019-04-28 DIAGNOSIS — M5383 Other specified dorsopathies, cervicothoracic region: Secondary | ICD-10-CM | POA: Diagnosis not present

## 2019-05-26 DIAGNOSIS — M9903 Segmental and somatic dysfunction of lumbar region: Secondary | ICD-10-CM | POA: Diagnosis not present

## 2019-05-26 DIAGNOSIS — S336XXA Sprain of sacroiliac joint, initial encounter: Secondary | ICD-10-CM | POA: Diagnosis not present

## 2019-05-26 DIAGNOSIS — M9901 Segmental and somatic dysfunction of cervical region: Secondary | ICD-10-CM | POA: Diagnosis not present

## 2019-05-26 DIAGNOSIS — M9905 Segmental and somatic dysfunction of pelvic region: Secondary | ICD-10-CM | POA: Diagnosis not present

## 2019-05-26 DIAGNOSIS — M9902 Segmental and somatic dysfunction of thoracic region: Secondary | ICD-10-CM | POA: Diagnosis not present

## 2019-06-21 DIAGNOSIS — M9901 Segmental and somatic dysfunction of cervical region: Secondary | ICD-10-CM | POA: Diagnosis not present

## 2019-06-21 DIAGNOSIS — S336XXA Sprain of sacroiliac joint, initial encounter: Secondary | ICD-10-CM | POA: Diagnosis not present

## 2019-06-21 DIAGNOSIS — M9902 Segmental and somatic dysfunction of thoracic region: Secondary | ICD-10-CM | POA: Diagnosis not present

## 2019-06-21 DIAGNOSIS — M9903 Segmental and somatic dysfunction of lumbar region: Secondary | ICD-10-CM | POA: Diagnosis not present

## 2019-06-21 DIAGNOSIS — M9905 Segmental and somatic dysfunction of pelvic region: Secondary | ICD-10-CM | POA: Diagnosis not present

## 2019-09-22 DIAGNOSIS — M9903 Segmental and somatic dysfunction of lumbar region: Secondary | ICD-10-CM | POA: Diagnosis not present

## 2019-09-22 DIAGNOSIS — S336XXA Sprain of sacroiliac joint, initial encounter: Secondary | ICD-10-CM | POA: Diagnosis not present

## 2019-09-22 DIAGNOSIS — M9901 Segmental and somatic dysfunction of cervical region: Secondary | ICD-10-CM | POA: Diagnosis not present

## 2019-09-22 DIAGNOSIS — M9905 Segmental and somatic dysfunction of pelvic region: Secondary | ICD-10-CM | POA: Diagnosis not present

## 2019-09-22 DIAGNOSIS — M9902 Segmental and somatic dysfunction of thoracic region: Secondary | ICD-10-CM | POA: Diagnosis not present

## 2019-10-12 DIAGNOSIS — L820 Inflamed seborrheic keratosis: Secondary | ICD-10-CM | POA: Diagnosis not present

## 2019-10-12 DIAGNOSIS — D1801 Hemangioma of skin and subcutaneous tissue: Secondary | ICD-10-CM | POA: Diagnosis not present

## 2019-10-12 DIAGNOSIS — D225 Melanocytic nevi of trunk: Secondary | ICD-10-CM | POA: Diagnosis not present

## 2019-10-12 DIAGNOSIS — D485 Neoplasm of uncertain behavior of skin: Secondary | ICD-10-CM | POA: Diagnosis not present

## 2019-10-12 DIAGNOSIS — D2239 Melanocytic nevi of other parts of face: Secondary | ICD-10-CM | POA: Diagnosis not present

## 2019-10-12 DIAGNOSIS — L814 Other melanin hyperpigmentation: Secondary | ICD-10-CM | POA: Diagnosis not present

## 2019-10-18 DIAGNOSIS — M9902 Segmental and somatic dysfunction of thoracic region: Secondary | ICD-10-CM | POA: Diagnosis not present

## 2019-10-18 DIAGNOSIS — M9905 Segmental and somatic dysfunction of pelvic region: Secondary | ICD-10-CM | POA: Diagnosis not present

## 2019-10-18 DIAGNOSIS — S336XXA Sprain of sacroiliac joint, initial encounter: Secondary | ICD-10-CM | POA: Diagnosis not present

## 2019-10-18 DIAGNOSIS — M9901 Segmental and somatic dysfunction of cervical region: Secondary | ICD-10-CM | POA: Diagnosis not present

## 2019-10-18 DIAGNOSIS — M9903 Segmental and somatic dysfunction of lumbar region: Secondary | ICD-10-CM | POA: Diagnosis not present

## 2019-10-28 DIAGNOSIS — D485 Neoplasm of uncertain behavior of skin: Secondary | ICD-10-CM | POA: Diagnosis not present

## 2019-10-28 DIAGNOSIS — L309 Dermatitis, unspecified: Secondary | ICD-10-CM | POA: Diagnosis not present

## 2019-11-21 DIAGNOSIS — Z87891 Personal history of nicotine dependence: Secondary | ICD-10-CM | POA: Diagnosis not present

## 2019-11-21 DIAGNOSIS — D6869 Other thrombophilia: Secondary | ICD-10-CM | POA: Diagnosis not present

## 2019-11-21 DIAGNOSIS — J449 Chronic obstructive pulmonary disease, unspecified: Secondary | ICD-10-CM | POA: Diagnosis not present

## 2019-11-21 DIAGNOSIS — I4891 Unspecified atrial fibrillation: Secondary | ICD-10-CM | POA: Diagnosis not present

## 2019-11-21 DIAGNOSIS — Z833 Family history of diabetes mellitus: Secondary | ICD-10-CM | POA: Diagnosis not present

## 2019-11-21 DIAGNOSIS — Z7951 Long term (current) use of inhaled steroids: Secondary | ICD-10-CM | POA: Diagnosis not present

## 2019-11-21 DIAGNOSIS — I10 Essential (primary) hypertension: Secondary | ICD-10-CM | POA: Diagnosis not present

## 2019-11-21 DIAGNOSIS — Z7901 Long term (current) use of anticoagulants: Secondary | ICD-10-CM | POA: Diagnosis not present

## 2020-01-25 DIAGNOSIS — L821 Other seborrheic keratosis: Secondary | ICD-10-CM | POA: Diagnosis not present

## 2020-01-25 DIAGNOSIS — L578 Other skin changes due to chronic exposure to nonionizing radiation: Secondary | ICD-10-CM | POA: Diagnosis not present

## 2020-01-25 DIAGNOSIS — L820 Inflamed seborrheic keratosis: Secondary | ICD-10-CM | POA: Diagnosis not present

## 2020-01-26 DIAGNOSIS — M9903 Segmental and somatic dysfunction of lumbar region: Secondary | ICD-10-CM | POA: Diagnosis not present

## 2020-01-26 DIAGNOSIS — S336XXA Sprain of sacroiliac joint, initial encounter: Secondary | ICD-10-CM | POA: Diagnosis not present

## 2020-01-26 DIAGNOSIS — M9905 Segmental and somatic dysfunction of pelvic region: Secondary | ICD-10-CM | POA: Diagnosis not present

## 2020-01-26 DIAGNOSIS — M9902 Segmental and somatic dysfunction of thoracic region: Secondary | ICD-10-CM | POA: Diagnosis not present

## 2020-01-26 DIAGNOSIS — M9901 Segmental and somatic dysfunction of cervical region: Secondary | ICD-10-CM | POA: Diagnosis not present

## 2020-02-02 DIAGNOSIS — M9901 Segmental and somatic dysfunction of cervical region: Secondary | ICD-10-CM | POA: Diagnosis not present

## 2020-02-02 DIAGNOSIS — M9905 Segmental and somatic dysfunction of pelvic region: Secondary | ICD-10-CM | POA: Diagnosis not present

## 2020-02-02 DIAGNOSIS — M9903 Segmental and somatic dysfunction of lumbar region: Secondary | ICD-10-CM | POA: Diagnosis not present

## 2020-02-02 DIAGNOSIS — M9902 Segmental and somatic dysfunction of thoracic region: Secondary | ICD-10-CM | POA: Diagnosis not present

## 2020-02-02 DIAGNOSIS — S336XXA Sprain of sacroiliac joint, initial encounter: Secondary | ICD-10-CM | POA: Diagnosis not present

## 2020-02-16 DIAGNOSIS — M9902 Segmental and somatic dysfunction of thoracic region: Secondary | ICD-10-CM | POA: Diagnosis not present

## 2020-02-16 DIAGNOSIS — S336XXA Sprain of sacroiliac joint, initial encounter: Secondary | ICD-10-CM | POA: Diagnosis not present

## 2020-02-16 DIAGNOSIS — M9901 Segmental and somatic dysfunction of cervical region: Secondary | ICD-10-CM | POA: Diagnosis not present

## 2020-02-16 DIAGNOSIS — M9903 Segmental and somatic dysfunction of lumbar region: Secondary | ICD-10-CM | POA: Diagnosis not present

## 2020-02-16 DIAGNOSIS — M9905 Segmental and somatic dysfunction of pelvic region: Secondary | ICD-10-CM | POA: Diagnosis not present

## 2020-03-01 DIAGNOSIS — S336XXA Sprain of sacroiliac joint, initial encounter: Secondary | ICD-10-CM | POA: Diagnosis not present

## 2020-03-01 DIAGNOSIS — M9903 Segmental and somatic dysfunction of lumbar region: Secondary | ICD-10-CM | POA: Diagnosis not present

## 2020-03-01 DIAGNOSIS — M9901 Segmental and somatic dysfunction of cervical region: Secondary | ICD-10-CM | POA: Diagnosis not present

## 2020-03-01 DIAGNOSIS — M9902 Segmental and somatic dysfunction of thoracic region: Secondary | ICD-10-CM | POA: Diagnosis not present

## 2020-03-01 DIAGNOSIS — M9905 Segmental and somatic dysfunction of pelvic region: Secondary | ICD-10-CM | POA: Diagnosis not present

## 2020-03-15 DIAGNOSIS — S336XXA Sprain of sacroiliac joint, initial encounter: Secondary | ICD-10-CM | POA: Diagnosis not present

## 2020-03-15 DIAGNOSIS — M9901 Segmental and somatic dysfunction of cervical region: Secondary | ICD-10-CM | POA: Diagnosis not present

## 2020-03-15 DIAGNOSIS — M9903 Segmental and somatic dysfunction of lumbar region: Secondary | ICD-10-CM | POA: Diagnosis not present

## 2020-03-15 DIAGNOSIS — M9905 Segmental and somatic dysfunction of pelvic region: Secondary | ICD-10-CM | POA: Diagnosis not present

## 2020-03-15 DIAGNOSIS — M9902 Segmental and somatic dysfunction of thoracic region: Secondary | ICD-10-CM | POA: Diagnosis not present

## 2020-03-29 DIAGNOSIS — M9905 Segmental and somatic dysfunction of pelvic region: Secondary | ICD-10-CM | POA: Diagnosis not present

## 2020-03-29 DIAGNOSIS — M9901 Segmental and somatic dysfunction of cervical region: Secondary | ICD-10-CM | POA: Diagnosis not present

## 2020-03-29 DIAGNOSIS — M9902 Segmental and somatic dysfunction of thoracic region: Secondary | ICD-10-CM | POA: Diagnosis not present

## 2020-03-29 DIAGNOSIS — M9903 Segmental and somatic dysfunction of lumbar region: Secondary | ICD-10-CM | POA: Diagnosis not present

## 2020-03-29 DIAGNOSIS — S336XXA Sprain of sacroiliac joint, initial encounter: Secondary | ICD-10-CM | POA: Diagnosis not present

## 2020-04-12 DIAGNOSIS — M9901 Segmental and somatic dysfunction of cervical region: Secondary | ICD-10-CM | POA: Diagnosis not present

## 2020-04-12 DIAGNOSIS — M9903 Segmental and somatic dysfunction of lumbar region: Secondary | ICD-10-CM | POA: Diagnosis not present

## 2020-04-12 DIAGNOSIS — M9905 Segmental and somatic dysfunction of pelvic region: Secondary | ICD-10-CM | POA: Diagnosis not present

## 2020-04-12 DIAGNOSIS — S336XXA Sprain of sacroiliac joint, initial encounter: Secondary | ICD-10-CM | POA: Diagnosis not present

## 2020-04-12 DIAGNOSIS — M9902 Segmental and somatic dysfunction of thoracic region: Secondary | ICD-10-CM | POA: Diagnosis not present

## 2020-05-03 DIAGNOSIS — M9901 Segmental and somatic dysfunction of cervical region: Secondary | ICD-10-CM | POA: Diagnosis not present

## 2020-05-03 DIAGNOSIS — M9902 Segmental and somatic dysfunction of thoracic region: Secondary | ICD-10-CM | POA: Diagnosis not present

## 2020-05-03 DIAGNOSIS — M9905 Segmental and somatic dysfunction of pelvic region: Secondary | ICD-10-CM | POA: Diagnosis not present

## 2020-05-03 DIAGNOSIS — M9903 Segmental and somatic dysfunction of lumbar region: Secondary | ICD-10-CM | POA: Diagnosis not present

## 2020-05-24 DIAGNOSIS — M9902 Segmental and somatic dysfunction of thoracic region: Secondary | ICD-10-CM | POA: Diagnosis not present

## 2020-05-24 DIAGNOSIS — M9903 Segmental and somatic dysfunction of lumbar region: Secondary | ICD-10-CM | POA: Diagnosis not present

## 2020-05-24 DIAGNOSIS — M9901 Segmental and somatic dysfunction of cervical region: Secondary | ICD-10-CM | POA: Diagnosis not present

## 2020-05-24 DIAGNOSIS — M9905 Segmental and somatic dysfunction of pelvic region: Secondary | ICD-10-CM | POA: Diagnosis not present

## 2020-06-14 DIAGNOSIS — M9902 Segmental and somatic dysfunction of thoracic region: Secondary | ICD-10-CM | POA: Diagnosis not present

## 2020-06-14 DIAGNOSIS — M9905 Segmental and somatic dysfunction of pelvic region: Secondary | ICD-10-CM | POA: Diagnosis not present

## 2020-06-14 DIAGNOSIS — M9903 Segmental and somatic dysfunction of lumbar region: Secondary | ICD-10-CM | POA: Diagnosis not present

## 2020-06-14 DIAGNOSIS — M9901 Segmental and somatic dysfunction of cervical region: Secondary | ICD-10-CM | POA: Diagnosis not present

## 2020-06-28 DIAGNOSIS — M9905 Segmental and somatic dysfunction of pelvic region: Secondary | ICD-10-CM | POA: Diagnosis not present

## 2020-06-28 DIAGNOSIS — M9901 Segmental and somatic dysfunction of cervical region: Secondary | ICD-10-CM | POA: Diagnosis not present

## 2020-06-28 DIAGNOSIS — M9903 Segmental and somatic dysfunction of lumbar region: Secondary | ICD-10-CM | POA: Diagnosis not present

## 2020-06-28 DIAGNOSIS — M9902 Segmental and somatic dysfunction of thoracic region: Secondary | ICD-10-CM | POA: Diagnosis not present

## 2020-08-24 DIAGNOSIS — M9901 Segmental and somatic dysfunction of cervical region: Secondary | ICD-10-CM | POA: Diagnosis not present

## 2020-08-24 DIAGNOSIS — M9902 Segmental and somatic dysfunction of thoracic region: Secondary | ICD-10-CM | POA: Diagnosis not present

## 2020-08-24 DIAGNOSIS — M9903 Segmental and somatic dysfunction of lumbar region: Secondary | ICD-10-CM | POA: Diagnosis not present

## 2020-08-24 DIAGNOSIS — M9905 Segmental and somatic dysfunction of pelvic region: Secondary | ICD-10-CM | POA: Diagnosis not present

## 2020-10-25 DIAGNOSIS — M9905 Segmental and somatic dysfunction of pelvic region: Secondary | ICD-10-CM | POA: Diagnosis not present

## 2020-10-25 DIAGNOSIS — M9901 Segmental and somatic dysfunction of cervical region: Secondary | ICD-10-CM | POA: Diagnosis not present

## 2020-10-25 DIAGNOSIS — M9902 Segmental and somatic dysfunction of thoracic region: Secondary | ICD-10-CM | POA: Diagnosis not present

## 2020-10-25 DIAGNOSIS — M9903 Segmental and somatic dysfunction of lumbar region: Secondary | ICD-10-CM | POA: Diagnosis not present

## 2021-02-08 DIAGNOSIS — L821 Other seborrheic keratosis: Secondary | ICD-10-CM | POA: Diagnosis not present

## 2021-02-08 DIAGNOSIS — L578 Other skin changes due to chronic exposure to nonionizing radiation: Secondary | ICD-10-CM | POA: Diagnosis not present

## 2021-02-08 DIAGNOSIS — L720 Epidermal cyst: Secondary | ICD-10-CM | POA: Diagnosis not present

## 2021-02-08 DIAGNOSIS — L820 Inflamed seborrheic keratosis: Secondary | ICD-10-CM | POA: Diagnosis not present

## 2021-02-08 DIAGNOSIS — L219 Seborrheic dermatitis, unspecified: Secondary | ICD-10-CM | POA: Diagnosis not present

## 2021-02-08 DIAGNOSIS — L304 Erythema intertrigo: Secondary | ICD-10-CM | POA: Diagnosis not present

## 2021-02-20 DIAGNOSIS — M9903 Segmental and somatic dysfunction of lumbar region: Secondary | ICD-10-CM | POA: Diagnosis not present

## 2021-02-20 DIAGNOSIS — M9902 Segmental and somatic dysfunction of thoracic region: Secondary | ICD-10-CM | POA: Diagnosis not present

## 2021-02-20 DIAGNOSIS — M9901 Segmental and somatic dysfunction of cervical region: Secondary | ICD-10-CM | POA: Diagnosis not present

## 2021-02-20 DIAGNOSIS — M9905 Segmental and somatic dysfunction of pelvic region: Secondary | ICD-10-CM | POA: Diagnosis not present

## 2021-03-13 DIAGNOSIS — M9902 Segmental and somatic dysfunction of thoracic region: Secondary | ICD-10-CM | POA: Diagnosis not present

## 2021-03-13 DIAGNOSIS — M9903 Segmental and somatic dysfunction of lumbar region: Secondary | ICD-10-CM | POA: Diagnosis not present

## 2021-03-13 DIAGNOSIS — M9905 Segmental and somatic dysfunction of pelvic region: Secondary | ICD-10-CM | POA: Diagnosis not present

## 2021-03-13 DIAGNOSIS — M9901 Segmental and somatic dysfunction of cervical region: Secondary | ICD-10-CM | POA: Diagnosis not present

## 2021-04-03 DIAGNOSIS — M9905 Segmental and somatic dysfunction of pelvic region: Secondary | ICD-10-CM | POA: Diagnosis not present

## 2021-04-03 DIAGNOSIS — M9903 Segmental and somatic dysfunction of lumbar region: Secondary | ICD-10-CM | POA: Diagnosis not present

## 2021-04-03 DIAGNOSIS — M9901 Segmental and somatic dysfunction of cervical region: Secondary | ICD-10-CM | POA: Diagnosis not present

## 2021-04-03 DIAGNOSIS — M9902 Segmental and somatic dysfunction of thoracic region: Secondary | ICD-10-CM | POA: Diagnosis not present

## 2021-04-17 DIAGNOSIS — M9905 Segmental and somatic dysfunction of pelvic region: Secondary | ICD-10-CM | POA: Diagnosis not present

## 2021-04-17 DIAGNOSIS — M9902 Segmental and somatic dysfunction of thoracic region: Secondary | ICD-10-CM | POA: Diagnosis not present

## 2021-04-17 DIAGNOSIS — M9901 Segmental and somatic dysfunction of cervical region: Secondary | ICD-10-CM | POA: Diagnosis not present

## 2021-04-17 DIAGNOSIS — M9903 Segmental and somatic dysfunction of lumbar region: Secondary | ICD-10-CM | POA: Diagnosis not present

## 2021-05-07 DIAGNOSIS — M9905 Segmental and somatic dysfunction of pelvic region: Secondary | ICD-10-CM | POA: Diagnosis not present

## 2021-05-07 DIAGNOSIS — M9901 Segmental and somatic dysfunction of cervical region: Secondary | ICD-10-CM | POA: Diagnosis not present

## 2021-05-07 DIAGNOSIS — M9902 Segmental and somatic dysfunction of thoracic region: Secondary | ICD-10-CM | POA: Diagnosis not present

## 2021-05-07 DIAGNOSIS — M9903 Segmental and somatic dysfunction of lumbar region: Secondary | ICD-10-CM | POA: Diagnosis not present

## 2021-05-14 DIAGNOSIS — M9901 Segmental and somatic dysfunction of cervical region: Secondary | ICD-10-CM | POA: Diagnosis not present

## 2021-05-14 DIAGNOSIS — M9903 Segmental and somatic dysfunction of lumbar region: Secondary | ICD-10-CM | POA: Diagnosis not present

## 2021-05-14 DIAGNOSIS — M7912 Myalgia of auxiliary muscles, head and neck: Secondary | ICD-10-CM | POA: Diagnosis not present

## 2021-05-14 DIAGNOSIS — M461 Sacroiliitis, not elsewhere classified: Secondary | ICD-10-CM | POA: Diagnosis not present

## 2021-05-14 DIAGNOSIS — M9905 Segmental and somatic dysfunction of pelvic region: Secondary | ICD-10-CM | POA: Diagnosis not present

## 2021-05-14 DIAGNOSIS — M4727 Other spondylosis with radiculopathy, lumbosacral region: Secondary | ICD-10-CM | POA: Diagnosis not present

## 2021-05-14 DIAGNOSIS — M9902 Segmental and somatic dysfunction of thoracic region: Secondary | ICD-10-CM | POA: Diagnosis not present

## 2021-06-05 DIAGNOSIS — M9902 Segmental and somatic dysfunction of thoracic region: Secondary | ICD-10-CM | POA: Diagnosis not present

## 2021-06-05 DIAGNOSIS — M4727 Other spondylosis with radiculopathy, lumbosacral region: Secondary | ICD-10-CM | POA: Diagnosis not present

## 2021-06-05 DIAGNOSIS — M9903 Segmental and somatic dysfunction of lumbar region: Secondary | ICD-10-CM | POA: Diagnosis not present

## 2021-06-05 DIAGNOSIS — M9901 Segmental and somatic dysfunction of cervical region: Secondary | ICD-10-CM | POA: Diagnosis not present

## 2021-06-05 DIAGNOSIS — M7912 Myalgia of auxiliary muscles, head and neck: Secondary | ICD-10-CM | POA: Diagnosis not present

## 2021-06-05 DIAGNOSIS — M461 Sacroiliitis, not elsewhere classified: Secondary | ICD-10-CM | POA: Diagnosis not present

## 2021-06-05 DIAGNOSIS — M9905 Segmental and somatic dysfunction of pelvic region: Secondary | ICD-10-CM | POA: Diagnosis not present

## 2021-06-13 DIAGNOSIS — M9902 Segmental and somatic dysfunction of thoracic region: Secondary | ICD-10-CM | POA: Diagnosis not present

## 2021-06-13 DIAGNOSIS — M9903 Segmental and somatic dysfunction of lumbar region: Secondary | ICD-10-CM | POA: Diagnosis not present

## 2021-06-13 DIAGNOSIS — M9905 Segmental and somatic dysfunction of pelvic region: Secondary | ICD-10-CM | POA: Diagnosis not present

## 2021-06-13 DIAGNOSIS — M461 Sacroiliitis, not elsewhere classified: Secondary | ICD-10-CM | POA: Diagnosis not present

## 2021-06-13 DIAGNOSIS — M9901 Segmental and somatic dysfunction of cervical region: Secondary | ICD-10-CM | POA: Diagnosis not present

## 2021-06-13 DIAGNOSIS — M4727 Other spondylosis with radiculopathy, lumbosacral region: Secondary | ICD-10-CM | POA: Diagnosis not present

## 2021-06-13 DIAGNOSIS — M7912 Myalgia of auxiliary muscles, head and neck: Secondary | ICD-10-CM | POA: Diagnosis not present

## 2021-07-18 DIAGNOSIS — M9901 Segmental and somatic dysfunction of cervical region: Secondary | ICD-10-CM | POA: Diagnosis not present

## 2021-07-18 DIAGNOSIS — M9902 Segmental and somatic dysfunction of thoracic region: Secondary | ICD-10-CM | POA: Diagnosis not present

## 2021-07-18 DIAGNOSIS — M9905 Segmental and somatic dysfunction of pelvic region: Secondary | ICD-10-CM | POA: Diagnosis not present

## 2021-07-18 DIAGNOSIS — M461 Sacroiliitis, not elsewhere classified: Secondary | ICD-10-CM | POA: Diagnosis not present

## 2021-07-18 DIAGNOSIS — M4727 Other spondylosis with radiculopathy, lumbosacral region: Secondary | ICD-10-CM | POA: Diagnosis not present

## 2021-07-18 DIAGNOSIS — M9903 Segmental and somatic dysfunction of lumbar region: Secondary | ICD-10-CM | POA: Diagnosis not present

## 2021-07-18 DIAGNOSIS — M7912 Myalgia of auxiliary muscles, head and neck: Secondary | ICD-10-CM | POA: Diagnosis not present

## 2021-08-03 ENCOUNTER — Ambulatory Visit: Payer: Non-veteran care | Admitting: Cardiology

## 2021-08-15 DIAGNOSIS — M9903 Segmental and somatic dysfunction of lumbar region: Secondary | ICD-10-CM | POA: Diagnosis not present

## 2021-08-15 DIAGNOSIS — M7912 Myalgia of auxiliary muscles, head and neck: Secondary | ICD-10-CM | POA: Diagnosis not present

## 2021-08-15 DIAGNOSIS — M9905 Segmental and somatic dysfunction of pelvic region: Secondary | ICD-10-CM | POA: Diagnosis not present

## 2021-08-15 DIAGNOSIS — M461 Sacroiliitis, not elsewhere classified: Secondary | ICD-10-CM | POA: Diagnosis not present

## 2021-08-15 DIAGNOSIS — M9902 Segmental and somatic dysfunction of thoracic region: Secondary | ICD-10-CM | POA: Diagnosis not present

## 2021-08-15 DIAGNOSIS — M9901 Segmental and somatic dysfunction of cervical region: Secondary | ICD-10-CM | POA: Diagnosis not present

## 2021-08-15 DIAGNOSIS — M4727 Other spondylosis with radiculopathy, lumbosacral region: Secondary | ICD-10-CM | POA: Diagnosis not present

## 2021-08-21 ENCOUNTER — Ambulatory Visit: Payer: Non-veteran care | Admitting: Cardiology

## 2021-09-11 DIAGNOSIS — M461 Sacroiliitis, not elsewhere classified: Secondary | ICD-10-CM | POA: Diagnosis not present

## 2021-09-11 DIAGNOSIS — M9903 Segmental and somatic dysfunction of lumbar region: Secondary | ICD-10-CM | POA: Diagnosis not present

## 2021-09-11 DIAGNOSIS — M9901 Segmental and somatic dysfunction of cervical region: Secondary | ICD-10-CM | POA: Diagnosis not present

## 2021-09-11 DIAGNOSIS — M9902 Segmental and somatic dysfunction of thoracic region: Secondary | ICD-10-CM | POA: Diagnosis not present

## 2021-09-11 DIAGNOSIS — M9905 Segmental and somatic dysfunction of pelvic region: Secondary | ICD-10-CM | POA: Diagnosis not present

## 2021-09-11 DIAGNOSIS — M4727 Other spondylosis with radiculopathy, lumbosacral region: Secondary | ICD-10-CM | POA: Diagnosis not present

## 2021-09-11 DIAGNOSIS — M7912 Myalgia of auxiliary muscles, head and neck: Secondary | ICD-10-CM | POA: Diagnosis not present

## 2021-10-01 ENCOUNTER — Other Ambulatory Visit: Payer: Self-pay

## 2021-10-01 DIAGNOSIS — J449 Chronic obstructive pulmonary disease, unspecified: Secondary | ICD-10-CM | POA: Insufficient documentation

## 2021-10-01 DIAGNOSIS — R0600 Dyspnea, unspecified: Secondary | ICD-10-CM | POA: Insufficient documentation

## 2021-10-01 DIAGNOSIS — I119 Hypertensive heart disease without heart failure: Secondary | ICD-10-CM

## 2021-10-01 DIAGNOSIS — G4733 Obstructive sleep apnea (adult) (pediatric): Secondary | ICD-10-CM | POA: Insufficient documentation

## 2021-10-01 DIAGNOSIS — I10 Essential (primary) hypertension: Secondary | ICD-10-CM | POA: Insufficient documentation

## 2021-10-01 DIAGNOSIS — M545 Low back pain, unspecified: Secondary | ICD-10-CM | POA: Insufficient documentation

## 2021-10-01 DIAGNOSIS — I4891 Unspecified atrial fibrillation: Secondary | ICD-10-CM | POA: Insufficient documentation

## 2021-10-01 HISTORY — DX: Hypertensive heart disease without heart failure: I11.9

## 2021-10-06 NOTE — Progress Notes (Deleted)
Cardiology Office Note:    Date:  10/06/2021   ID:  Christian Rojas, DOB May 06, 1946, MRN [id number]  PCP:  Helen Hashimoto., MD  Cardiologist:  Shirlee More, MD   Referring MD: Helen Hashimoto., MD  ASSESSMENT:    No diagnosis found. PLAN:    In order of problems listed above:  ***  Next appointment   Medication Adjustments/Labs and Tests Ordered: Current medicines are reviewed at length with the patient today.  Concerns regarding medicines are outlined above.  No orders of the defined types were placed in this encounter.  No orders of the defined types were placed in this encounter.    No chief complaint on file. ***  History of Present Illness:    Christian Rojas is a 76 y.o. male who is being seen today for the evaluation of *** at the request of Helen Hashimoto., MD.  Myocardial perfusion study performed 05/31/2021 through the [facility] hospital normal left ventricular function EF 75% normal myocardial perfusion.  Echocardiogram 09/09/2017 at the [place] showed mild concentric LVH normal ejection fraction 60 to 65%.  Right ventricle is normal in size and function there is no significant valvular abnormality and the pulmonary artery systolic pressure was elevated 41 to 46 mmHg the report does not describe atrial size.    Echocardiogram performed at the [facility] hospital again 09/19/2021 that showed moderate concentric LVH normal ejection fraction left atrium moderately to severely dilated aortic valve was described as moderate sclerosis no gradient no regurgitation.  Is moderate to severe mitral annular calcification seen with mild mitral regurgitation no described mitral stenosis and moderate tricuspid regurgitation.    Record review shows a history of longstanding asymptomatic atrial fibrillation maintained on anticoagulation with Xarelto and hypertension obstructive sleep apnea on CPAP. Past Medical History:  Diagnosis Date   Atrial fibrillation  (HCC)    COPD (chronic obstructive pulmonary disease) (HCC)    Dyspnea    Hypertension    Low back pain    OSA (obstructive sleep apnea)     *** The histories are not reviewed yet. Please review them in the "History" navigator section and refresh this [place].  Current Medications: No outpatient medications have been marked as taking for the 10/09/21 encounter (Appointment) with Richardo Priest, MD.     Allergies:   Patient has no known allergies.   Social History   Socioeconomic History   Marital status: Married    Spouse name: Not on file   Number of children: Not on file   Years of education: Not on file   Highest education level: Not on file  Occupational History   Not on file  Tobacco Use   Smoking status: Former    Types: Cigarettes   Smokeless tobacco: Never  Substance and Sexual Activity   Alcohol use: Yes    Alcohol/week: 12.0 standard drinks    Types: 12 Cans of beer per week   Drug use: Not on file   Sexual activity: Not on file  Other Topics Concern   Not on file  Social History Narrative   Not on file   Social Determinants of Health   Financial Resource Strain: Not on file  Food Insecurity: Not on file  Transportation Needs: Not on file  Physical Activity: Not on file  Stress: Not on file  Social Connections: Not on file     Family History: The patient's ***family history includes COPD in his father.  ROS:   ROS Please see  the history of present illness.    *** All other systems reviewed and are negative.  EKGs/Labs/Other Studies Reviewed:    The following studies were reviewed today: ***  EKG:  EKG is *** ordered today.  The ekg ordered today is personally reviewed and demonstrates ***  Recent Labs: No results found for requested labs within last 8760 hours.  Recent Lipid Panel No results found for: CHOL, TRIG, HDL, CHOLHDL, VLDL, LDLCALC, LDLDIRECT  Physical Exam:    VS:  There were no vitals taken for this visit.    Wt  Readings from Last 3 Encounters:  No data found for Wt     GEN: *** Well nourished, well developed in no acute distress HEENT: Normal NECK: No JVD; No carotid bruits LYMPHATICS: No lymphadenopathy CARDIAC: ***RRR, no murmurs, rubs, gallops RESPIRATORY:  Clear to auscultation without rales, wheezing or rhonchi  ABDOMEN: Soft, non-tender, non-distended MUSCULOSKELETAL:  No edema; No deformity  SKIN: Warm and dry NEUROLOGIC:  Alert and oriented x 3 PSYCHIATRIC:  Normal affect     Signed, Shirlee More, MD  10/06/2021 1:14 PM    [facility]

## 2021-10-09 ENCOUNTER — Encounter: Payer: Self-pay | Admitting: Cardiology

## 2021-10-09 ENCOUNTER — Other Ambulatory Visit: Payer: Self-pay

## 2021-10-09 ENCOUNTER — Ambulatory Visit (INDEPENDENT_AMBULATORY_CARE_PROVIDER_SITE_OTHER): Payer: Medicare HMO | Admitting: Cardiology

## 2021-10-09 VITALS — BP 126/72 | HR 84 | Ht 71.0 in | Wt 262.4 lb

## 2021-10-09 DIAGNOSIS — I4819 Other persistent atrial fibrillation: Secondary | ICD-10-CM

## 2021-10-09 DIAGNOSIS — M5416 Radiculopathy, lumbar region: Secondary | ICD-10-CM | POA: Insufficient documentation

## 2021-10-09 DIAGNOSIS — Z7901 Long term (current) use of anticoagulants: Secondary | ICD-10-CM | POA: Diagnosis not present

## 2021-10-09 DIAGNOSIS — Z0289 Encounter for other administrative examinations: Secondary | ICD-10-CM | POA: Insufficient documentation

## 2021-10-09 DIAGNOSIS — G8929 Other chronic pain: Secondary | ICD-10-CM | POA: Insufficient documentation

## 2021-10-09 DIAGNOSIS — H9319 Tinnitus, unspecified ear: Secondary | ICD-10-CM | POA: Insufficient documentation

## 2021-10-09 DIAGNOSIS — I119 Hypertensive heart disease without heart failure: Secondary | ICD-10-CM

## 2021-10-09 DIAGNOSIS — K635 Polyp of colon: Secondary | ICD-10-CM | POA: Insufficient documentation

## 2021-10-09 DIAGNOSIS — M5412 Radiculopathy, cervical region: Secondary | ICD-10-CM | POA: Insufficient documentation

## 2021-10-09 DIAGNOSIS — I5032 Chronic diastolic (congestive) heart failure: Secondary | ICD-10-CM | POA: Insufficient documentation

## 2021-10-09 NOTE — Progress Notes (Signed)
Cardiology Office Note:    Date:  10/09/2021   ID:  Christian Rojas, DOB 1946/07/27, MRN [id number]  PCP:  Helen Hashimoto., MD  Cardiologist:  Shirlee More, MD    Referring MD: Helen Hashimoto., MD    ASSESSMENT:    1. Persistent atrial fibrillation ([place])   2. Hypertensive heart disease without heart failure   3. Chronic anticoagulation    PLAN:    In order of problems listed above:  He has persistent longstanding chronic atrial fibrillation rate controlled with low-dose beta-blocker and continue his anticoagulant.  He inquired about taking ibuprofen I told him he could take 1 a day I offered him a PPI and he declines. Stable blood pressure at target continue his beta-blocker   Next appointment: 6 months   Medication Adjustments/Labs and Tests Ordered: Current medicines are reviewed at length with the patient today.  Concerns regarding medicines are outlined above.  Orders Placed This Encounter  Procedures   EKG 12-Lead   No orders of the defined types were placed in this encounter.   Chief Complaint  Patient presents with   Atrial Fibrillation  Previously cared for at the [facility] hospital History of Present Illness:    Christian Rojas is a 75 y.o. male who is being seen today for the evaluation of atrial fibrillation at the request of Helen Hashimoto., MD.   Myocardial perfusion study performed 05/31/2021 through the [facility] normal left ventricular function EF 75% normal myocardial perfusion.   Echocardiogram 09/09/2017 at the [place] showed mild concentric LVH normal ejection fraction 60 to 65%.  Right ventricle is normal in size and function there is no significant valvular abnormality and the pulmonary artery systolic pressure was elevated 41 to 46 mmHg the report does not describe atrial size.     Echocardiogram performed at the [facility] hospital again 09/19/2021 that showed moderate concentric LVH normal ejection fraction left atrium  moderately to severely dilated aortic valve was described as moderate sclerosis no gradient no regurgitation.  Is moderate to severe mitral annular calcification seen with mild mitral regurgitation no described mitral stenosis and moderate tricuspid regurgitation.     Record review shows a history of longstanding asymptomatic atrial fibrillation maintained on anticoagulation with Xarelto and hypertension obstructive sleep apnea on CPAP.  Compliance with diet, lifestyle and medications: Yes  He wants to establish cardiology care he took care of his wife years ago with paroxysmal atrial fibrillation and tachycardia induced cardiomyopathy responded well to EP catheter ablation. Onset of atrial fibrillation in 2008 and since that time he has been relatively asymptomatic anticoagulated on rate control.  He has been taking aspirin and anticoagulant and aspirin was stopped. Has had no bleeding complication from his anticoagulant No shortness of breath chest pain palpitation or syncope he has chronic edema in the left leg. He has no known history of congenital rheumatic heart disease. He does not have heart failure at the time of his last echocardiogram he was in atrial fibrillation and diastolic function was not over read. Past Medical History:  Diagnosis Date   Atrial fibrillation (HCC)    COPD (chronic obstructive pulmonary disease) (HCC)    Dyspnea    Hypertension    Low back pain    OSA (obstructive sleep apnea)     Past Surgical History:  Procedure Laterality Date   OTHER SURGICAL HISTORY Right    elbow repair   UMBILICAL HERNIA REPAIR      Current Medications: Current Meds  Medication Sig   atenolol (TENORMIN) 25 MG tablet Take 12.5 mg by mouth daily.   rivaroxaban (XARELTO) 20 MG TABS tablet Take 20 mg by mouth daily with supper.     Allergies:   Patient has no known allergies.   Social History   Socioeconomic History   Marital status: Married    Spouse name: Not on file    Number of children: Not on file   Years of education: Not on file   Highest education level: Not on file  Occupational History   Not on file  Tobacco Use   Smoking status: Former    Types: Cigarettes   Smokeless tobacco: Never  Substance and Sexual Activity   Alcohol use: Yes    Alcohol/week: 12.0 standard drinks    Types: 12 Cans of beer per week   Drug use: Not on file   Sexual activity: Not on file  Other Topics Concern   Not on file  Social History Narrative   Not on file   Social Determinants of Health   Financial Resource Strain: Not on file  Food Insecurity: Not on file  Transportation Needs: Not on file  Physical Activity: Not on file  Stress: Not on file  Social Connections: Not on file     Family History: The patient's family history includes COPD in his father. ROS:   Please see the history of present illness.    All other systems reviewed and are negative.  EKGs/Labs/Other Studies Reviewed:    The following studies were reviewed today:  EKG:  EKG ordered today and personally reviewed.  The ekg ordered today demonstrates rate controlled atrial fibrillation rightward QRS axis    Physical Exam:    VS:  BP 126/72    Pulse 84    Ht 5\' 11"  (1.803 m)    Wt 262 lb 6.4 oz (119 kg)    SpO2 94%    BMI 36.60 kg/m     Wt Readings from Last 3 Encounters:  10/09/21 262 lb 6.4 oz (119 kg)     GEN: Appears his age well nourished, well developed in no acute distress HEENT: Normal NECK: No JVD; No carotid bruits LYMPHATICS: No lymphadenopathy CARDIAC: Irregular rate and rhythm  no murmurs, rubs, gallops RESPIRATORY:  Clear to auscultation without rales, wheezing or rhonchi  ABDOMEN: Soft, non-tender, non-distended MUSCULOSKELETAL:  No edema; No deformity  SKIN: Warm and dry NEUROLOGIC:  Alert and oriented x 3 PSYCHIATRIC:  Normal affect    Signed, Shirlee More, MD  10/09/2021 4:26 PM    [facility]

## 2021-10-09 NOTE — Patient Instructions (Signed)

## 2021-10-17 DIAGNOSIS — M9902 Segmental and somatic dysfunction of thoracic region: Secondary | ICD-10-CM | POA: Diagnosis not present

## 2021-10-17 DIAGNOSIS — M9903 Segmental and somatic dysfunction of lumbar region: Secondary | ICD-10-CM | POA: Diagnosis not present

## 2021-10-17 DIAGNOSIS — M9905 Segmental and somatic dysfunction of pelvic region: Secondary | ICD-10-CM | POA: Diagnosis not present

## 2021-10-17 DIAGNOSIS — M7912 Myalgia of auxiliary muscles, head and neck: Secondary | ICD-10-CM | POA: Diagnosis not present

## 2021-10-17 DIAGNOSIS — M461 Sacroiliitis, not elsewhere classified: Secondary | ICD-10-CM | POA: Diagnosis not present

## 2021-10-17 DIAGNOSIS — M4727 Other spondylosis with radiculopathy, lumbosacral region: Secondary | ICD-10-CM | POA: Diagnosis not present

## 2021-10-17 DIAGNOSIS — M9901 Segmental and somatic dysfunction of cervical region: Secondary | ICD-10-CM | POA: Diagnosis not present

## 2021-11-29 DIAGNOSIS — M7912 Myalgia of auxiliary muscles, head and neck: Secondary | ICD-10-CM | POA: Diagnosis not present

## 2021-11-29 DIAGNOSIS — M9905 Segmental and somatic dysfunction of pelvic region: Secondary | ICD-10-CM | POA: Diagnosis not present

## 2021-11-29 DIAGNOSIS — M461 Sacroiliitis, not elsewhere classified: Secondary | ICD-10-CM | POA: Diagnosis not present

## 2021-11-29 DIAGNOSIS — M9903 Segmental and somatic dysfunction of lumbar region: Secondary | ICD-10-CM | POA: Diagnosis not present

## 2021-11-29 DIAGNOSIS — M9901 Segmental and somatic dysfunction of cervical region: Secondary | ICD-10-CM | POA: Diagnosis not present

## 2021-11-29 DIAGNOSIS — M9902 Segmental and somatic dysfunction of thoracic region: Secondary | ICD-10-CM | POA: Diagnosis not present

## 2021-11-29 DIAGNOSIS — M4727 Other spondylosis with radiculopathy, lumbosacral region: Secondary | ICD-10-CM | POA: Diagnosis not present

## 2022-01-23 DIAGNOSIS — M9901 Segmental and somatic dysfunction of cervical region: Secondary | ICD-10-CM | POA: Diagnosis not present

## 2022-01-23 DIAGNOSIS — M4727 Other spondylosis with radiculopathy, lumbosacral region: Secondary | ICD-10-CM | POA: Diagnosis not present

## 2022-01-23 DIAGNOSIS — M9902 Segmental and somatic dysfunction of thoracic region: Secondary | ICD-10-CM | POA: Diagnosis not present

## 2022-01-23 DIAGNOSIS — M9903 Segmental and somatic dysfunction of lumbar region: Secondary | ICD-10-CM | POA: Diagnosis not present

## 2022-01-23 DIAGNOSIS — M7912 Myalgia of auxiliary muscles, head and neck: Secondary | ICD-10-CM | POA: Diagnosis not present

## 2022-01-23 DIAGNOSIS — M461 Sacroiliitis, not elsewhere classified: Secondary | ICD-10-CM | POA: Diagnosis not present

## 2022-01-23 DIAGNOSIS — M9905 Segmental and somatic dysfunction of pelvic region: Secondary | ICD-10-CM | POA: Diagnosis not present

## 2022-02-06 DIAGNOSIS — M9902 Segmental and somatic dysfunction of thoracic region: Secondary | ICD-10-CM | POA: Diagnosis not present

## 2022-02-06 DIAGNOSIS — M7912 Myalgia of auxiliary muscles, head and neck: Secondary | ICD-10-CM | POA: Diagnosis not present

## 2022-02-06 DIAGNOSIS — M9903 Segmental and somatic dysfunction of lumbar region: Secondary | ICD-10-CM | POA: Diagnosis not present

## 2022-02-06 DIAGNOSIS — M9901 Segmental and somatic dysfunction of cervical region: Secondary | ICD-10-CM | POA: Diagnosis not present

## 2022-02-06 DIAGNOSIS — M4727 Other spondylosis with radiculopathy, lumbosacral region: Secondary | ICD-10-CM | POA: Diagnosis not present

## 2022-02-06 DIAGNOSIS — M9905 Segmental and somatic dysfunction of pelvic region: Secondary | ICD-10-CM | POA: Diagnosis not present

## 2022-02-06 DIAGNOSIS — M461 Sacroiliitis, not elsewhere classified: Secondary | ICD-10-CM | POA: Diagnosis not present

## 2022-02-11 DIAGNOSIS — B079 Viral wart, unspecified: Secondary | ICD-10-CM | POA: Diagnosis not present

## 2022-02-11 DIAGNOSIS — L578 Other skin changes due to chronic exposure to nonionizing radiation: Secondary | ICD-10-CM | POA: Diagnosis not present

## 2022-02-11 DIAGNOSIS — L821 Other seborrheic keratosis: Secondary | ICD-10-CM | POA: Diagnosis not present

## 2022-02-11 DIAGNOSIS — L820 Inflamed seborrheic keratosis: Secondary | ICD-10-CM | POA: Diagnosis not present

## 2022-02-25 DIAGNOSIS — M9905 Segmental and somatic dysfunction of pelvic region: Secondary | ICD-10-CM | POA: Diagnosis not present

## 2022-02-25 DIAGNOSIS — M9902 Segmental and somatic dysfunction of thoracic region: Secondary | ICD-10-CM | POA: Diagnosis not present

## 2022-02-25 DIAGNOSIS — M9903 Segmental and somatic dysfunction of lumbar region: Secondary | ICD-10-CM | POA: Diagnosis not present

## 2022-02-25 DIAGNOSIS — M9901 Segmental and somatic dysfunction of cervical region: Secondary | ICD-10-CM | POA: Diagnosis not present

## 2022-02-25 DIAGNOSIS — M461 Sacroiliitis, not elsewhere classified: Secondary | ICD-10-CM | POA: Diagnosis not present

## 2022-02-25 DIAGNOSIS — M7912 Myalgia of auxiliary muscles, head and neck: Secondary | ICD-10-CM | POA: Diagnosis not present

## 2022-02-25 DIAGNOSIS — M4727 Other spondylosis with radiculopathy, lumbosacral region: Secondary | ICD-10-CM | POA: Diagnosis not present

## 2022-03-25 DIAGNOSIS — M9905 Segmental and somatic dysfunction of pelvic region: Secondary | ICD-10-CM | POA: Diagnosis not present

## 2022-03-25 DIAGNOSIS — M9901 Segmental and somatic dysfunction of cervical region: Secondary | ICD-10-CM | POA: Diagnosis not present

## 2022-03-25 DIAGNOSIS — M9902 Segmental and somatic dysfunction of thoracic region: Secondary | ICD-10-CM | POA: Diagnosis not present

## 2022-03-25 DIAGNOSIS — M4727 Other spondylosis with radiculopathy, lumbosacral region: Secondary | ICD-10-CM | POA: Diagnosis not present

## 2022-03-25 DIAGNOSIS — M461 Sacroiliitis, not elsewhere classified: Secondary | ICD-10-CM | POA: Diagnosis not present

## 2022-03-25 DIAGNOSIS — M9903 Segmental and somatic dysfunction of lumbar region: Secondary | ICD-10-CM | POA: Diagnosis not present

## 2022-03-25 DIAGNOSIS — M7912 Myalgia of auxiliary muscles, head and neck: Secondary | ICD-10-CM | POA: Diagnosis not present

## 2022-04-03 DIAGNOSIS — Z79899 Other long term (current) drug therapy: Secondary | ICD-10-CM | POA: Diagnosis not present

## 2022-04-03 DIAGNOSIS — Z0000 Encounter for general adult medical examination without abnormal findings: Secondary | ICD-10-CM | POA: Diagnosis not present

## 2022-04-03 DIAGNOSIS — R739 Hyperglycemia, unspecified: Secondary | ICD-10-CM | POA: Diagnosis not present

## 2022-04-16 DIAGNOSIS — M9901 Segmental and somatic dysfunction of cervical region: Secondary | ICD-10-CM | POA: Diagnosis not present

## 2022-04-16 DIAGNOSIS — M9905 Segmental and somatic dysfunction of pelvic region: Secondary | ICD-10-CM | POA: Diagnosis not present

## 2022-04-16 DIAGNOSIS — M9902 Segmental and somatic dysfunction of thoracic region: Secondary | ICD-10-CM | POA: Diagnosis not present

## 2022-04-16 DIAGNOSIS — M4727 Other spondylosis with radiculopathy, lumbosacral region: Secondary | ICD-10-CM | POA: Diagnosis not present

## 2022-04-16 DIAGNOSIS — M9903 Segmental and somatic dysfunction of lumbar region: Secondary | ICD-10-CM | POA: Diagnosis not present

## 2022-04-16 DIAGNOSIS — M461 Sacroiliitis, not elsewhere classified: Secondary | ICD-10-CM | POA: Diagnosis not present

## 2022-04-16 DIAGNOSIS — M7912 Myalgia of auxiliary muscles, head and neck: Secondary | ICD-10-CM | POA: Diagnosis not present

## 2022-05-02 ENCOUNTER — Encounter: Payer: Self-pay | Admitting: Cardiology

## 2022-05-02 ENCOUNTER — Ambulatory Visit (INDEPENDENT_AMBULATORY_CARE_PROVIDER_SITE_OTHER): Payer: Medicare HMO | Admitting: Cardiology

## 2022-05-02 VITALS — BP 110/80 | HR 52 | Ht 71.0 in | Wt 266.0 lb

## 2022-05-02 DIAGNOSIS — I5032 Chronic diastolic (congestive) heart failure: Secondary | ICD-10-CM | POA: Diagnosis not present

## 2022-05-02 DIAGNOSIS — I4819 Other persistent atrial fibrillation: Secondary | ICD-10-CM | POA: Diagnosis not present

## 2022-05-02 DIAGNOSIS — G4733 Obstructive sleep apnea (adult) (pediatric): Secondary | ICD-10-CM

## 2022-05-02 DIAGNOSIS — I110 Hypertensive heart disease with heart failure: Secondary | ICD-10-CM

## 2022-05-02 DIAGNOSIS — Z7901 Long term (current) use of anticoagulants: Secondary | ICD-10-CM | POA: Diagnosis not present

## 2022-05-02 MED ORDER — FUROSEMIDE 20 MG PO TABS: 20.0000 mg | ORAL_TABLET | Freq: Every day | ORAL | 3 refills | Status: DC

## 2022-05-02 NOTE — Progress Notes (Signed)
Cardiology Office Note:     Very Date:  05/02/2022   ID:  Samuel Bouche, DOB 07/21/1946, MRN [id number]  PCP:  Helen Hashimoto., MD  Cardiologist:  Shirlee More, MD    Referring MD: Helen Hashimoto., MD    ASSESSMENT:    1. Persistent atrial fibrillation ([place])   2. Chronic anticoagulation   3. Hypertensive heart disease with chronic diastolic congestive heart failure ([place])   4. OSA (obstructive sleep apnea)    PLAN:    In order of problems listed above:  Rate controlled with his beta-blocker continue Tenormin as well as an anticoagulant Although his underlying lung disease COPD and obstructive sleep apnea clearly has heart failure decompensated fluid overload he will initiate a diuretic 2 weeks check BMP proBNP and drop off a list of his home weights and blood pressures.   Next appointment: 3 months   Medication Adjustments/Labs and Tests Ordered: Current medicines are reviewed at length with the patient today.  Concerns regarding medicines are outlined above.  No orders of the defined types were placed in this encounter.  No orders of the defined types were placed in this encounter.   Chief Complaint  Patient presents with   Follow-up   Atrial Fibrillation   Congestive Heart Failure    History of Present Illness:    Louay Myrie is a 76 y.o. male with a hx of  atrial fibrillation with chronic anticoagulation and hypertensive heart disease. Echocardiogram 09/09/2017 at the [facility] showed mild concentric LVH normal ejection fraction 60 to 65%.  Right ventricle is normal in size and function there is no significant valvular abnormality and the pulmonary artery systolic pressure was elevated 41 to 46 mmHg with moderate severe left atrial enlargement.  Mitral and calcification was present aortic valve was sclerotic top normal velocity across the valve no significant stenosis.  1 echocardiogram performed at the [facility] hospital again 09/19/2021  that showed moderate concentric LVH normal ejection fraction left atrium moderately to severely dilated aortic valve was described as moderate sclerosis no gradient no regurgitation.  Is moderate to severe mitral annular calcification seen with mild mitral regurgitation no described mitral stenosis and moderate tricuspid regurgitation.     Record review shows a history of longstanding asymptomatic atrial fibrillation maintained on anticoagulation with Xarelto and hypertension obstructive sleep apnea on CPAP.  He was last seen 10/09/2021 and rate controlled atrial fibrillation.  Compliance with diet, lifestyle and medications: Yes  He has chronic shortness of breath especially at nighttime when he goes to bed typical orthopnea when he bends over and when he walks outdoors to the car he also notices peripheral edema he is not on a loop diuretic. Home heart rates 60s and 70s blood pressure at target less than 130 140/90 No chest pain palpitation or syncope He has had no anticoagulant related bleeding  04/03/2022: Cholesterol 188 LDL 123 A1c 6.3% creatinine 7 potassium 4.3 hemoglobin 14.2 Past Medical History:  Diagnosis Date   Atrial fibrillation (HCC)    COPD (chronic obstructive pulmonary disease) (HCC)    Dyspnea    Hypertension    Low back pain    OSA (obstructive sleep apnea)     Past Surgical History:  Procedure Laterality Date   OTHER SURGICAL HISTORY Right    elbow repair   UMBILICAL HERNIA REPAIR      Current Medications: Current Meds  Medication Sig   atenolol (TENORMIN) 25 MG tablet Take 12.5 mg by mouth daily.   rivaroxaban (  XARELTO) 20 MG TABS tablet Take 20 mg by mouth daily with supper.     Allergies:   Patient has no known allergies.   Social History   Socioeconomic History   Marital status: Married    Spouse name: Not on file   Number of children: Not on file   Years of education: Not on file   Highest education level: Not on file  Occupational History    Not on file  Tobacco Use   Smoking status: Former    Types: Cigarettes   Smokeless tobacco: Never  Substance and Sexual Activity   Alcohol use: Yes    Alcohol/week: 12.0 standard drinks of alcohol    Types: 12 Cans of beer per week   Drug use: Not on file   Sexual activity: Not on file  Other Topics Concern   Not on file  Social History Narrative   Not on file   Social Determinants of Health   Financial Resource Strain: Not on file  Food Insecurity: Not on file  Transportation Needs: Not on file  Physical Activity: Not on file  Stress: Not on file  Social Connections: Not on file     Family History: The patient's family history includes COPD in his father. ROS:   Please see the history of present illness.    All other systems reviewed and are negative.  EKGs/Labs/Other Studies Reviewed:    The following studies were reviewed today:    Recent Labs: [facility] 03/10/2020 potassium 4.1 creatinine 1.24 hemoglobin 14.7  Physical Exam:    VS:  BP 110/80 (BP Location: Left Arm, Patient Position: Sitting, Cuff Size: Normal)   Pulse (!) 52   Ht '5\' 11"'$  (1.803 m)   Wt 266 lb (120.7 kg)   SpO2 96%   BMI 37.10 kg/m     Wt Readings from Last 3 Encounters:  05/02/22 266 lb (120.7 kg)  10/09/21 262 lb 6.4 oz (119 kg)     GEN:  Well nourished, well developed in no acute distress HEENT: Normal NECK: No JVD; No carotid bruits LYMPHATICS: No lymphadenopathy CARDIAC: Irregular rate and rhythm no murmurs, rubs, gallops RESPIRATORY: Diminished breath sounds no rales or wheezing ABDOMEN: Soft, non-tender, non-distended MUSCULOSKELETAL: 2-3+ bilateral lower extremity to the knee pitting edema; No deformity  SKIN: Warm and dry NEUROLOGIC:  Alert and oriented x 3 PSYCHIATRIC:  Normal affect    Signed, Shirlee More, MD  05/02/2022 2:05 PM    [place]

## 2022-05-02 NOTE — Patient Instructions (Signed)
Medication Instructions:  Your physician has recommended you make the following change in your medication:   START: Furosemide 20 mg daily  *If you need a refill on your cardiac medications before your next appointment, please call your pharmacy*   Lab Work: Your physician recommends that you return for lab work in:   Labs today: BMP, Pro BNP  If you have labs (blood work) drawn today and your tests are completely normal, you will receive your results only by: MyChart Message (if you have MyChart) OR A paper copy in the mail If you have any lab test that is abnormal or we need to change your treatment, we will call you to review the results.   Testing/Procedures: None   Follow-Up: At [facility], you and your health needs are our priority.  As part of our continuing mission to provide you with exceptional heart care, we have created designated Provider Care Teams.  These Care Teams include your primary Cardiologist (physician) and Advanced Practice Providers (APPs -  Physician Assistants and Nurse Practitioners) who all work together to provide you with the care you need, when you need it.  We recommend signing up for the patient portal called "MyChart".  Sign up information is provided on this After Visit Summary.  MyChart is used to connect with patients for Virtual Visits (Telemedicine).  Patients are able to view lab/test results, encounter notes, upcoming appointments, etc.  Non-urgent messages can be sent to your provider as well.   To learn more about what you can do with MyChart, go to [email or web address].    Your next appointment:   3 month(s)  The format for your next appointment:   In Person  Provider:   Shirlee More, MD    Other Instructions In 2 weeks drop off a list of weights and blood pressures.  Important Information About Sugar

## 2022-05-03 LAB — BASIC METABOLIC PANEL
BUN/Creatinine Ratio: 12 (ref 10–24)
BUN: 12 mg/dL (ref 8–27)
CO2: 22 mmol/L (ref 20–29)
Calcium: 9.5 mg/dL (ref 8.6–10.2)
Chloride: 100 mmol/L (ref 96–106)
Creatinine, Ser: 1.02 mg/dL (ref 0.76–1.27)
Glucose: 95 mg/dL (ref 70–99)
Potassium: 4.4 mmol/L (ref 3.5–5.2)
Sodium: 138 mmol/L (ref 134–144)
eGFR: 76 mL/min/{1.73_m2} (ref >59)

## 2022-05-03 LAB — PRO B NATRIURETIC PEPTIDE: NT-Pro BNP: 780 pg/mL — ABNORMAL HIGH (ref 0–486)

## 2022-05-07 DIAGNOSIS — M9903 Segmental and somatic dysfunction of lumbar region: Secondary | ICD-10-CM | POA: Diagnosis not present

## 2022-05-07 DIAGNOSIS — M9905 Segmental and somatic dysfunction of pelvic region: Secondary | ICD-10-CM | POA: Diagnosis not present

## 2022-05-07 DIAGNOSIS — M9901 Segmental and somatic dysfunction of cervical region: Secondary | ICD-10-CM | POA: Diagnosis not present

## 2022-05-07 DIAGNOSIS — M7912 Myalgia of auxiliary muscles, head and neck: Secondary | ICD-10-CM | POA: Diagnosis not present

## 2022-05-07 DIAGNOSIS — M9902 Segmental and somatic dysfunction of thoracic region: Secondary | ICD-10-CM | POA: Diagnosis not present

## 2022-05-07 DIAGNOSIS — M4727 Other spondylosis with radiculopathy, lumbosacral region: Secondary | ICD-10-CM | POA: Diagnosis not present

## 2022-05-07 DIAGNOSIS — M461 Sacroiliitis, not elsewhere classified: Secondary | ICD-10-CM | POA: Diagnosis not present

## 2022-06-05 DIAGNOSIS — M9902 Segmental and somatic dysfunction of thoracic region: Secondary | ICD-10-CM | POA: Diagnosis not present

## 2022-06-05 DIAGNOSIS — M9901 Segmental and somatic dysfunction of cervical region: Secondary | ICD-10-CM | POA: Diagnosis not present

## 2022-06-05 DIAGNOSIS — M7912 Myalgia of auxiliary muscles, head and neck: Secondary | ICD-10-CM | POA: Diagnosis not present

## 2022-06-05 DIAGNOSIS — M9903 Segmental and somatic dysfunction of lumbar region: Secondary | ICD-10-CM | POA: Diagnosis not present

## 2022-06-05 DIAGNOSIS — M9905 Segmental and somatic dysfunction of pelvic region: Secondary | ICD-10-CM | POA: Diagnosis not present

## 2022-06-05 DIAGNOSIS — M461 Sacroiliitis, not elsewhere classified: Secondary | ICD-10-CM | POA: Diagnosis not present

## 2022-06-05 DIAGNOSIS — M4727 Other spondylosis with radiculopathy, lumbosacral region: Secondary | ICD-10-CM | POA: Diagnosis not present

## 2022-06-20 DIAGNOSIS — J449 Chronic obstructive pulmonary disease, unspecified: Secondary | ICD-10-CM | POA: Diagnosis not present

## 2022-06-20 DIAGNOSIS — Z6837 Body mass index (BMI) 37.0-37.9, adult: Secondary | ICD-10-CM | POA: Diagnosis not present

## 2022-08-12 DIAGNOSIS — M546 Pain in thoracic spine: Secondary | ICD-10-CM | POA: Diagnosis not present

## 2022-08-12 DIAGNOSIS — M62830 Muscle spasm of back: Secondary | ICD-10-CM | POA: Diagnosis not present

## 2022-08-12 DIAGNOSIS — M461 Sacroiliitis, not elsewhere classified: Secondary | ICD-10-CM | POA: Diagnosis not present

## 2022-08-12 DIAGNOSIS — M9901 Segmental and somatic dysfunction of cervical region: Secondary | ICD-10-CM | POA: Diagnosis not present

## 2022-08-12 DIAGNOSIS — M9903 Segmental and somatic dysfunction of lumbar region: Secondary | ICD-10-CM | POA: Diagnosis not present

## 2022-08-12 DIAGNOSIS — M542 Cervicalgia: Secondary | ICD-10-CM | POA: Diagnosis not present

## 2022-08-12 DIAGNOSIS — M9904 Segmental and somatic dysfunction of sacral region: Secondary | ICD-10-CM | POA: Diagnosis not present

## 2022-08-12 DIAGNOSIS — M25561 Pain in right knee: Secondary | ICD-10-CM | POA: Diagnosis not present

## 2022-08-12 DIAGNOSIS — M9902 Segmental and somatic dysfunction of thoracic region: Secondary | ICD-10-CM | POA: Diagnosis not present

## 2022-08-15 DIAGNOSIS — M9903 Segmental and somatic dysfunction of lumbar region: Secondary | ICD-10-CM | POA: Diagnosis not present

## 2022-08-15 DIAGNOSIS — M461 Sacroiliitis, not elsewhere classified: Secondary | ICD-10-CM | POA: Diagnosis not present

## 2022-08-15 DIAGNOSIS — M9901 Segmental and somatic dysfunction of cervical region: Secondary | ICD-10-CM | POA: Diagnosis not present

## 2022-08-15 DIAGNOSIS — M9904 Segmental and somatic dysfunction of sacral region: Secondary | ICD-10-CM | POA: Diagnosis not present

## 2022-08-15 DIAGNOSIS — M9902 Segmental and somatic dysfunction of thoracic region: Secondary | ICD-10-CM | POA: Diagnosis not present

## 2022-08-15 DIAGNOSIS — M542 Cervicalgia: Secondary | ICD-10-CM | POA: Diagnosis not present

## 2022-08-15 DIAGNOSIS — M25561 Pain in right knee: Secondary | ICD-10-CM | POA: Diagnosis not present

## 2022-08-15 DIAGNOSIS — M62830 Muscle spasm of back: Secondary | ICD-10-CM | POA: Diagnosis not present

## 2022-08-15 DIAGNOSIS — M546 Pain in thoracic spine: Secondary | ICD-10-CM | POA: Diagnosis not present

## 2022-08-19 DIAGNOSIS — M62830 Muscle spasm of back: Secondary | ICD-10-CM | POA: Diagnosis not present

## 2022-08-19 DIAGNOSIS — M9904 Segmental and somatic dysfunction of sacral region: Secondary | ICD-10-CM | POA: Diagnosis not present

## 2022-08-19 DIAGNOSIS — M9903 Segmental and somatic dysfunction of lumbar region: Secondary | ICD-10-CM | POA: Diagnosis not present

## 2022-08-19 DIAGNOSIS — M9902 Segmental and somatic dysfunction of thoracic region: Secondary | ICD-10-CM | POA: Diagnosis not present

## 2022-08-19 DIAGNOSIS — M542 Cervicalgia: Secondary | ICD-10-CM | POA: Diagnosis not present

## 2022-08-19 DIAGNOSIS — M546 Pain in thoracic spine: Secondary | ICD-10-CM | POA: Diagnosis not present

## 2022-08-19 DIAGNOSIS — M9901 Segmental and somatic dysfunction of cervical region: Secondary | ICD-10-CM | POA: Diagnosis not present

## 2022-08-19 DIAGNOSIS — M461 Sacroiliitis, not elsewhere classified: Secondary | ICD-10-CM | POA: Diagnosis not present

## 2022-08-19 DIAGNOSIS — M25561 Pain in right knee: Secondary | ICD-10-CM | POA: Diagnosis not present

## 2022-08-22 DIAGNOSIS — M9903 Segmental and somatic dysfunction of lumbar region: Secondary | ICD-10-CM | POA: Diagnosis not present

## 2022-08-22 DIAGNOSIS — M9904 Segmental and somatic dysfunction of sacral region: Secondary | ICD-10-CM | POA: Diagnosis not present

## 2022-08-22 DIAGNOSIS — M25561 Pain in right knee: Secondary | ICD-10-CM | POA: Diagnosis not present

## 2022-08-22 DIAGNOSIS — M546 Pain in thoracic spine: Secondary | ICD-10-CM | POA: Diagnosis not present

## 2022-08-22 DIAGNOSIS — M62830 Muscle spasm of back: Secondary | ICD-10-CM | POA: Diagnosis not present

## 2022-08-22 DIAGNOSIS — M9901 Segmental and somatic dysfunction of cervical region: Secondary | ICD-10-CM | POA: Diagnosis not present

## 2022-08-22 DIAGNOSIS — M461 Sacroiliitis, not elsewhere classified: Secondary | ICD-10-CM | POA: Diagnosis not present

## 2022-08-22 DIAGNOSIS — M542 Cervicalgia: Secondary | ICD-10-CM | POA: Diagnosis not present

## 2022-08-22 DIAGNOSIS — M9902 Segmental and somatic dysfunction of thoracic region: Secondary | ICD-10-CM | POA: Diagnosis not present

## 2022-08-29 DIAGNOSIS — M546 Pain in thoracic spine: Secondary | ICD-10-CM | POA: Diagnosis not present

## 2022-08-29 DIAGNOSIS — M9902 Segmental and somatic dysfunction of thoracic region: Secondary | ICD-10-CM | POA: Diagnosis not present

## 2022-08-29 DIAGNOSIS — M461 Sacroiliitis, not elsewhere classified: Secondary | ICD-10-CM | POA: Diagnosis not present

## 2022-08-29 DIAGNOSIS — M9901 Segmental and somatic dysfunction of cervical region: Secondary | ICD-10-CM | POA: Diagnosis not present

## 2022-08-29 DIAGNOSIS — M62830 Muscle spasm of back: Secondary | ICD-10-CM | POA: Diagnosis not present

## 2022-08-29 DIAGNOSIS — M25561 Pain in right knee: Secondary | ICD-10-CM | POA: Diagnosis not present

## 2022-08-29 DIAGNOSIS — M9903 Segmental and somatic dysfunction of lumbar region: Secondary | ICD-10-CM | POA: Diagnosis not present

## 2022-08-29 DIAGNOSIS — M9904 Segmental and somatic dysfunction of sacral region: Secondary | ICD-10-CM | POA: Diagnosis not present

## 2022-08-29 DIAGNOSIS — M542 Cervicalgia: Secondary | ICD-10-CM | POA: Diagnosis not present

## 2022-09-02 DIAGNOSIS — M25561 Pain in right knee: Secondary | ICD-10-CM | POA: Diagnosis not present

## 2022-09-02 DIAGNOSIS — M546 Pain in thoracic spine: Secondary | ICD-10-CM | POA: Diagnosis not present

## 2022-09-02 DIAGNOSIS — M461 Sacroiliitis, not elsewhere classified: Secondary | ICD-10-CM | POA: Diagnosis not present

## 2022-09-02 DIAGNOSIS — M542 Cervicalgia: Secondary | ICD-10-CM | POA: Diagnosis not present

## 2022-09-02 DIAGNOSIS — M9901 Segmental and somatic dysfunction of cervical region: Secondary | ICD-10-CM | POA: Diagnosis not present

## 2022-09-02 DIAGNOSIS — M9903 Segmental and somatic dysfunction of lumbar region: Secondary | ICD-10-CM | POA: Diagnosis not present

## 2022-09-02 DIAGNOSIS — M9902 Segmental and somatic dysfunction of thoracic region: Secondary | ICD-10-CM | POA: Diagnosis not present

## 2022-09-02 DIAGNOSIS — M62830 Muscle spasm of back: Secondary | ICD-10-CM | POA: Diagnosis not present

## 2022-09-03 DIAGNOSIS — M7912 Myalgia of auxiliary muscles, head and neck: Secondary | ICD-10-CM | POA: Diagnosis not present

## 2022-09-03 DIAGNOSIS — M9905 Segmental and somatic dysfunction of pelvic region: Secondary | ICD-10-CM | POA: Diagnosis not present

## 2022-09-03 DIAGNOSIS — M9902 Segmental and somatic dysfunction of thoracic region: Secondary | ICD-10-CM | POA: Diagnosis not present

## 2022-09-03 DIAGNOSIS — M9903 Segmental and somatic dysfunction of lumbar region: Secondary | ICD-10-CM | POA: Diagnosis not present

## 2022-09-03 DIAGNOSIS — M9901 Segmental and somatic dysfunction of cervical region: Secondary | ICD-10-CM | POA: Diagnosis not present

## 2022-09-03 DIAGNOSIS — M461 Sacroiliitis, not elsewhere classified: Secondary | ICD-10-CM | POA: Diagnosis not present

## 2022-09-03 DIAGNOSIS — M4727 Other spondylosis with radiculopathy, lumbosacral region: Secondary | ICD-10-CM | POA: Diagnosis not present

## 2022-09-04 DIAGNOSIS — I10 Essential (primary) hypertension: Secondary | ICD-10-CM | POA: Insufficient documentation

## 2022-09-04 NOTE — Progress Notes (Unsigned)
Cardiology Office Note:    Date:  09/05/2022   ID:  Christian Rojas, DOB [birth year], MRN [id number]  PCP:  Helen Hashimoto., MD  Cardiologist:  Shirlee More, MD    Referring MD: Helen Hashimoto., MD    ASSESSMENT:    1. Persistent atrial fibrillation ([place])   2. Chronic anticoagulation   3. Hypertensive heart disease without heart failure   4. OSA (obstructive sleep apnea)    PLAN:    In order of problems listed above:  Overall doing better his atrial fibrillation is rate controlled with the beta-blocker on appropriate anticoagulation with rivaroxaban and his heart failure is quite improved and will continue his current diuretic. Labs followed through the [facility]   Next appointment: 9 months   Medication Adjustments/Labs and Tests Ordered: Current medicines are reviewed at length with the patient today.  Concerns regarding medicines are outlined above.  Orders Placed This Encounter  Procedures   Basic Metabolic Panel (BMET)   EKG 12-Lead   Meds ordered this encounter  Medications   furosemide (LASIX) 20 MG tablet    Sig: Take 1 tablet (20 mg total) by mouth daily. Weigh daily and if weight increases 5 pounds take an extra tablet of Furosemide daily    Dispense:  90 tablet    Refill:  3    Chief Complaint  Patient presents with   Follow-up   Atrial Fibrillation   Congestive Heart Failure    History of Present Illness:    [name] is a 76 y.o. male with a hx of persistent atrial fibrillation with chronic anticoagulation COPD obstructive sleep apnea and hypertensive heart disease with heart failure last seen 05/02/2022 with decompensated heart failure..  Echocardiogram 09/09/2017 at the [place] showed mild concentric LVH normal ejection fraction 60 to 65%.  Right ventricle is normal in size and function there is no significant valvular abnormality and the pulmonary artery systolic pressure was elevated 41 to 46 mmHg with  moderate severe left atrial enlargement.  Mitral and calcification was present aortic valve was sclerotic top normal velocity across the valve no significant stenosis. Repeat echocardiogram performed at the [facility] hospital again 09/19/2021 that showed moderate concentric LVH normal ejection fraction left atrium moderately to severely dilated aortic valve was described as moderate sclerosis no gradient no regurgitation.  Is moderate to severe mitral annular calcification seen with mild mitral regurgitation no described mitral stenosis and moderate tricuspid regurgitation.     Record review shows a history of longstanding asymptomatic atrial fibrillation maintained on anticoagulation with Xarelto and hypertension obstructive sleep apnea on CPAP.  Compliance with diet, lifestyle and medications: Yes  Overall he is doing much better he has a mild degree of peripheral edema he attributes to chronic venous insufficiency He is pleased he no longer shortness of breath orthopnea PND chest pain palpitation or syncope Has had no bleeding complication of his anticoagulant He is is the [facility] for healthcare Past Medical History:  Diagnosis Date   Atrial fibrillation (HCC)    COPD (chronic obstructive pulmonary disease) (HCC)    Dyspnea    Hypertension    Low back pain    OSA (obstructive sleep apnea)     Past Surgical History:  Procedure Laterality Date   OTHER SURGICAL HISTORY Right    elbow repair   UMBILICAL HERNIA REPAIR      Current Medications: Current Meds  Medication Sig   atenolol (TENORMIN) 25 MG tablet Take 12.5 mg by mouth daily.  furosemide (LASIX) 20 MG tablet Take 1 tablet (20 mg total) by mouth daily. Weigh daily and if weight increases 5 pounds take an extra tablet of Furosemide daily   rivaroxaban (XARELTO) 20 MG TABS tablet Take 20 mg by mouth daily with supper.   [DISCONTINUED] furosemide (LASIX) 20 MG tablet Take 1 tablet (20 mg total) by mouth daily.     Allergies:    Patient has no known allergies.   Social History   Socioeconomic History   Marital status: Married    Spouse name: Not on file   Number of children: Not on file   Years of education: Not on file   Highest education level: Not on file  Occupational History   Not on file  Tobacco Use   Smoking status: Former    Types: Cigarettes   Smokeless tobacco: Never  Substance and Sexual Activity   Alcohol use: Yes    Alcohol/week: 12.0 standard drinks of alcohol    Types: 12 Cans of beer per week   Drug use: Not on file   Sexual activity: Not on file  Other Topics Concern   Not on file  Social History Narrative   Not on file   Social Determinants of Health   Financial Resource Strain: Not on file  Food Insecurity: Not on file  Transportation Needs: Not on file  Physical Activity: Not on file  Stress: Not on file  Social Connections: Not on file     Family History: The patient's family history includes COPD in his father. ROS:   Please see the history of present illness.    All other systems reviewed and are negative.  EKGs/Labs/Other Studies Reviewed:    The following studies were reviewed today:  EKG:  EKG ordered today and personally reviewed.  The ekg ordered today demonstrates rate controlled atrial fibrillation 66 bpm otherwise normal EKG  Recent Labs: 05/02/2022: BUN 12; Creatinine, Ser 1.02; NT-Pro BNP 780; Potassium 4.4; Sodium 138  Recent Lipid Panel No results found for: "CHOL", "TRIG", "HDL", "CHOLHDL", "VLDL", "LDLCALC", "LDLDIRECT"  Physical Exam:    VS:  BP 120/70 (BP Location: Right Arm, Patient Position: Sitting, Cuff Size: Normal)   Pulse 66   Ht '5\' 11"'$  (1.803 m)   Wt 264 lb (119.7 kg)   SpO2 95%   BMI 36.82 kg/m     Wt Readings from Last 3 Encounters:  09/05/22 264 lb (119.7 kg)  05/02/22 266 lb (120.7 kg)  10/09/21 262 lb 6.4 oz (119 kg)     GEN:  Well nourished, well developed in no acute distress HEENT: Normal NECK: No JVD; No carotid  bruits LYMPHATICS: No lymphadenopathy CARDIAC: Irregular rate and rhythm RRR, no murmurs, rubs, gallops RESPIRATORY:  Clear to auscultation without rales, wheezing or rhonchi  ABDOMEN: Soft, non-tender, non-distended MUSCULOSKELETAL: 1+ bilateral lower extremity pitting edema; No deformity  SKIN: Warm and dry NEUROLOGIC:  Alert and oriented x 3 PSYCHIATRIC:  Normal affect    Signed, Shirlee More, MD  09/05/2022 5:18 PM    [facility]

## 2022-09-05 ENCOUNTER — Encounter: Payer: Self-pay | Admitting: Cardiology

## 2022-09-05 ENCOUNTER — Ambulatory Visit: Payer: Medicare HMO | Attending: Cardiology | Admitting: Cardiology

## 2022-09-05 VITALS — BP 120/70 | HR 66 | Ht 71.0 in | Wt 264.0 lb

## 2022-09-05 DIAGNOSIS — M9902 Segmental and somatic dysfunction of thoracic region: Secondary | ICD-10-CM | POA: Diagnosis not present

## 2022-09-05 DIAGNOSIS — M9903 Segmental and somatic dysfunction of lumbar region: Secondary | ICD-10-CM | POA: Diagnosis not present

## 2022-09-05 DIAGNOSIS — M4727 Other spondylosis with radiculopathy, lumbosacral region: Secondary | ICD-10-CM | POA: Diagnosis not present

## 2022-09-05 DIAGNOSIS — Z7901 Long term (current) use of anticoagulants: Secondary | ICD-10-CM

## 2022-09-05 DIAGNOSIS — I119 Hypertensive heart disease without heart failure: Secondary | ICD-10-CM | POA: Diagnosis not present

## 2022-09-05 DIAGNOSIS — I4819 Other persistent atrial fibrillation: Secondary | ICD-10-CM | POA: Diagnosis not present

## 2022-09-05 DIAGNOSIS — G4733 Obstructive sleep apnea (adult) (pediatric): Secondary | ICD-10-CM | POA: Diagnosis not present

## 2022-09-05 DIAGNOSIS — M461 Sacroiliitis, not elsewhere classified: Secondary | ICD-10-CM | POA: Diagnosis not present

## 2022-09-05 DIAGNOSIS — M9905 Segmental and somatic dysfunction of pelvic region: Secondary | ICD-10-CM | POA: Diagnosis not present

## 2022-09-05 DIAGNOSIS — M9901 Segmental and somatic dysfunction of cervical region: Secondary | ICD-10-CM | POA: Diagnosis not present

## 2022-09-05 DIAGNOSIS — M7912 Myalgia of auxiliary muscles, head and neck: Secondary | ICD-10-CM | POA: Diagnosis not present

## 2022-09-05 MED ORDER — FUROSEMIDE 20 MG PO TABS: 20.0000 mg | ORAL_TABLET | Freq: Every day | ORAL | 3 refills | Status: DC

## 2022-09-05 NOTE — Patient Instructions (Signed)
Medication Instructions:  Your physician has recommended you make the following change in your medication:   START: Furosemide 20 mg daily (Weigh daily and if weight increases 5 pounds take an extra tablet of furosemide daily)  *If you need a refill on your cardiac medications before your next appointment, please call your pharmacy*   Lab Work: Your physician recommends that you return for lab work in:   Labs today: BMP  If you have labs (blood work) drawn today and your tests are completely normal, you will receive your results only by: [place] (if you have [place]) OR A paper copy in the mail If you have any lab test that is abnormal or we need to change your treatment, we will call you to review the results.   Testing/Procedures: None   Follow-Up: At [facility], you and your health needs are our priority.  As part of our continuing mission to provide you with exceptional heart care, we have created designated Provider Care Teams.  These Care Teams include your primary Cardiologist (physician) and Advanced Practice Providers (APPs -  Physician Assistants and Nurse Practitioners) who all work together to provide you with the care you need, when you need it.  We recommend signing up for the patient portal called "MyChart".  Sign up information is provided on this After Visit Summary.  MyChart is used to connect with patients for Virtual Visits (Telemedicine).  Patients are able to view lab/test results, encounter notes, upcoming appointments, etc.  Non-urgent messages can be sent to your provider as well.   To learn more about what you can do with MyChart, go to [email or web address].    Your next appointment:   6 month(s)  The format for your next appointment:   In Person  Provider:   Shirlee More, MD    Other Instructions Weigh daily   Important Information About Sugar

## 2022-09-06 LAB — BASIC METABOLIC PANEL
BUN/Creatinine Ratio: 12 (ref 10–24)
BUN: 13 mg/dL (ref 8–27)
CO2: 26 mmol/L (ref 20–29)
Calcium: 9.4 mg/dL (ref 8.6–10.2)
Chloride: 98 mmol/L (ref 96–106)
Creatinine, Ser: 1.07 mg/dL (ref 0.76–1.27)
Glucose: 80 mg/dL (ref 70–99)
Potassium: 4.1 mmol/L (ref 3.5–5.2)
Sodium: 139 mmol/L (ref 134–144)
eGFR: 71 mL/min/{1.73_m2} (ref >59)

## 2022-09-20 DIAGNOSIS — M25561 Pain in right knee: Secondary | ICD-10-CM | POA: Diagnosis not present

## 2022-09-20 DIAGNOSIS — M1711 Unilateral primary osteoarthritis, right knee: Secondary | ICD-10-CM | POA: Diagnosis not present

## 2022-10-07 DIAGNOSIS — M25561 Pain in right knee: Secondary | ICD-10-CM | POA: Diagnosis not present

## 2022-10-07 DIAGNOSIS — M1711 Unilateral primary osteoarthritis, right knee: Secondary | ICD-10-CM | POA: Diagnosis not present

## 2022-10-07 DIAGNOSIS — G8929 Other chronic pain: Secondary | ICD-10-CM | POA: Diagnosis not present

## 2022-11-27 DIAGNOSIS — M4727 Other spondylosis with radiculopathy, lumbosacral region: Secondary | ICD-10-CM | POA: Diagnosis not present

## 2022-11-27 DIAGNOSIS — M9901 Segmental and somatic dysfunction of cervical region: Secondary | ICD-10-CM | POA: Diagnosis not present

## 2022-11-27 DIAGNOSIS — M9903 Segmental and somatic dysfunction of lumbar region: Secondary | ICD-10-CM | POA: Diagnosis not present

## 2022-11-27 DIAGNOSIS — M9905 Segmental and somatic dysfunction of pelvic region: Secondary | ICD-10-CM | POA: Diagnosis not present

## 2022-11-27 DIAGNOSIS — M7912 Myalgia of auxiliary muscles, head and neck: Secondary | ICD-10-CM | POA: Diagnosis not present

## 2022-11-27 DIAGNOSIS — M9902 Segmental and somatic dysfunction of thoracic region: Secondary | ICD-10-CM | POA: Diagnosis not present

## 2022-11-27 DIAGNOSIS — M461 Sacroiliitis, not elsewhere classified: Secondary | ICD-10-CM | POA: Diagnosis not present

## 2022-12-12 DIAGNOSIS — M62830 Muscle spasm of back: Secondary | ICD-10-CM | POA: Diagnosis not present

## 2022-12-12 DIAGNOSIS — M542 Cervicalgia: Secondary | ICD-10-CM | POA: Diagnosis not present

## 2022-12-12 DIAGNOSIS — M9902 Segmental and somatic dysfunction of thoracic region: Secondary | ICD-10-CM | POA: Diagnosis not present

## 2022-12-12 DIAGNOSIS — M9904 Segmental and somatic dysfunction of sacral region: Secondary | ICD-10-CM | POA: Diagnosis not present

## 2022-12-12 DIAGNOSIS — M9901 Segmental and somatic dysfunction of cervical region: Secondary | ICD-10-CM | POA: Diagnosis not present

## 2022-12-12 DIAGNOSIS — M461 Sacroiliitis, not elsewhere classified: Secondary | ICD-10-CM | POA: Diagnosis not present

## 2022-12-12 DIAGNOSIS — M9903 Segmental and somatic dysfunction of lumbar region: Secondary | ICD-10-CM | POA: Diagnosis not present

## 2022-12-12 DIAGNOSIS — M546 Pain in thoracic spine: Secondary | ICD-10-CM | POA: Diagnosis not present

## 2022-12-12 DIAGNOSIS — M25561 Pain in right knee: Secondary | ICD-10-CM | POA: Diagnosis not present

## 2023-01-09 DIAGNOSIS — M7912 Myalgia of auxiliary muscles, head and neck: Secondary | ICD-10-CM | POA: Diagnosis not present

## 2023-01-09 DIAGNOSIS — M9905 Segmental and somatic dysfunction of pelvic region: Secondary | ICD-10-CM | POA: Diagnosis not present

## 2023-01-09 DIAGNOSIS — M9903 Segmental and somatic dysfunction of lumbar region: Secondary | ICD-10-CM | POA: Diagnosis not present

## 2023-01-09 DIAGNOSIS — M9902 Segmental and somatic dysfunction of thoracic region: Secondary | ICD-10-CM | POA: Diagnosis not present

## 2023-01-09 DIAGNOSIS — M4727 Other spondylosis with radiculopathy, lumbosacral region: Secondary | ICD-10-CM | POA: Diagnosis not present

## 2023-01-09 DIAGNOSIS — M9901 Segmental and somatic dysfunction of cervical region: Secondary | ICD-10-CM | POA: Diagnosis not present

## 2023-01-09 DIAGNOSIS — M461 Sacroiliitis, not elsewhere classified: Secondary | ICD-10-CM | POA: Diagnosis not present

## 2023-01-27 DIAGNOSIS — M9903 Segmental and somatic dysfunction of lumbar region: Secondary | ICD-10-CM | POA: Diagnosis not present

## 2023-01-27 DIAGNOSIS — M4727 Other spondylosis with radiculopathy, lumbosacral region: Secondary | ICD-10-CM | POA: Diagnosis not present

## 2023-01-27 DIAGNOSIS — M9901 Segmental and somatic dysfunction of cervical region: Secondary | ICD-10-CM | POA: Diagnosis not present

## 2023-01-27 DIAGNOSIS — M7912 Myalgia of auxiliary muscles, head and neck: Secondary | ICD-10-CM | POA: Diagnosis not present

## 2023-01-27 DIAGNOSIS — M461 Sacroiliitis, not elsewhere classified: Secondary | ICD-10-CM | POA: Diagnosis not present

## 2023-01-27 DIAGNOSIS — M9905 Segmental and somatic dysfunction of pelvic region: Secondary | ICD-10-CM | POA: Diagnosis not present

## 2023-01-27 DIAGNOSIS — M9902 Segmental and somatic dysfunction of thoracic region: Secondary | ICD-10-CM | POA: Diagnosis not present

## 2023-01-30 DIAGNOSIS — M9902 Segmental and somatic dysfunction of thoracic region: Secondary | ICD-10-CM | POA: Diagnosis not present

## 2023-01-30 DIAGNOSIS — M461 Sacroiliitis, not elsewhere classified: Secondary | ICD-10-CM | POA: Diagnosis not present

## 2023-01-30 DIAGNOSIS — M9901 Segmental and somatic dysfunction of cervical region: Secondary | ICD-10-CM | POA: Diagnosis not present

## 2023-01-30 DIAGNOSIS — M9903 Segmental and somatic dysfunction of lumbar region: Secondary | ICD-10-CM | POA: Diagnosis not present

## 2023-01-30 DIAGNOSIS — M546 Pain in thoracic spine: Secondary | ICD-10-CM | POA: Diagnosis not present

## 2023-01-30 DIAGNOSIS — M542 Cervicalgia: Secondary | ICD-10-CM | POA: Diagnosis not present

## 2023-01-30 DIAGNOSIS — M9904 Segmental and somatic dysfunction of sacral region: Secondary | ICD-10-CM | POA: Diagnosis not present

## 2023-01-30 DIAGNOSIS — M62830 Muscle spasm of back: Secondary | ICD-10-CM | POA: Diagnosis not present

## 2023-01-30 DIAGNOSIS — M25561 Pain in right knee: Secondary | ICD-10-CM | POA: Diagnosis not present

## 2023-02-06 DIAGNOSIS — M9902 Segmental and somatic dysfunction of thoracic region: Secondary | ICD-10-CM | POA: Diagnosis not present

## 2023-02-06 DIAGNOSIS — M7912 Myalgia of auxiliary muscles, head and neck: Secondary | ICD-10-CM | POA: Diagnosis not present

## 2023-02-06 DIAGNOSIS — M9903 Segmental and somatic dysfunction of lumbar region: Secondary | ICD-10-CM | POA: Diagnosis not present

## 2023-02-06 DIAGNOSIS — M9905 Segmental and somatic dysfunction of pelvic region: Secondary | ICD-10-CM | POA: Diagnosis not present

## 2023-02-06 DIAGNOSIS — M461 Sacroiliitis, not elsewhere classified: Secondary | ICD-10-CM | POA: Diagnosis not present

## 2023-02-06 DIAGNOSIS — M9901 Segmental and somatic dysfunction of cervical region: Secondary | ICD-10-CM | POA: Diagnosis not present

## 2023-02-06 DIAGNOSIS — M4727 Other spondylosis with radiculopathy, lumbosacral region: Secondary | ICD-10-CM | POA: Diagnosis not present

## 2023-02-11 DIAGNOSIS — M4727 Other spondylosis with radiculopathy, lumbosacral region: Secondary | ICD-10-CM | POA: Diagnosis not present

## 2023-02-11 DIAGNOSIS — L570 Actinic keratosis: Secondary | ICD-10-CM | POA: Diagnosis not present

## 2023-02-11 DIAGNOSIS — L578 Other skin changes due to chronic exposure to nonionizing radiation: Secondary | ICD-10-CM | POA: Diagnosis not present

## 2023-02-11 DIAGNOSIS — M9902 Segmental and somatic dysfunction of thoracic region: Secondary | ICD-10-CM | POA: Diagnosis not present

## 2023-02-11 DIAGNOSIS — M461 Sacroiliitis, not elsewhere classified: Secondary | ICD-10-CM | POA: Diagnosis not present

## 2023-02-11 DIAGNOSIS — M7912 Myalgia of auxiliary muscles, head and neck: Secondary | ICD-10-CM | POA: Diagnosis not present

## 2023-02-11 DIAGNOSIS — M9903 Segmental and somatic dysfunction of lumbar region: Secondary | ICD-10-CM | POA: Diagnosis not present

## 2023-02-11 DIAGNOSIS — M9901 Segmental and somatic dysfunction of cervical region: Secondary | ICD-10-CM | POA: Diagnosis not present

## 2023-02-11 DIAGNOSIS — D2272 Melanocytic nevi of left lower limb, including hip: Secondary | ICD-10-CM | POA: Diagnosis not present

## 2023-02-11 DIAGNOSIS — L821 Other seborrheic keratosis: Secondary | ICD-10-CM | POA: Diagnosis not present

## 2023-02-11 DIAGNOSIS — M9905 Segmental and somatic dysfunction of pelvic region: Secondary | ICD-10-CM | POA: Diagnosis not present

## 2023-02-25 DIAGNOSIS — M461 Sacroiliitis, not elsewhere classified: Secondary | ICD-10-CM | POA: Diagnosis not present

## 2023-02-25 DIAGNOSIS — M9903 Segmental and somatic dysfunction of lumbar region: Secondary | ICD-10-CM | POA: Diagnosis not present

## 2023-02-25 DIAGNOSIS — M4727 Other spondylosis with radiculopathy, lumbosacral region: Secondary | ICD-10-CM | POA: Diagnosis not present

## 2023-02-25 DIAGNOSIS — M9902 Segmental and somatic dysfunction of thoracic region: Secondary | ICD-10-CM | POA: Diagnosis not present

## 2023-02-25 DIAGNOSIS — M7912 Myalgia of auxiliary muscles, head and neck: Secondary | ICD-10-CM | POA: Diagnosis not present

## 2023-02-25 DIAGNOSIS — M9901 Segmental and somatic dysfunction of cervical region: Secondary | ICD-10-CM | POA: Diagnosis not present

## 2023-02-25 DIAGNOSIS — M9905 Segmental and somatic dysfunction of pelvic region: Secondary | ICD-10-CM | POA: Diagnosis not present

## 2023-03-25 DIAGNOSIS — M9905 Segmental and somatic dysfunction of pelvic region: Secondary | ICD-10-CM | POA: Diagnosis not present

## 2023-03-25 DIAGNOSIS — M7912 Myalgia of auxiliary muscles, head and neck: Secondary | ICD-10-CM | POA: Diagnosis not present

## 2023-03-25 DIAGNOSIS — M461 Sacroiliitis, not elsewhere classified: Secondary | ICD-10-CM | POA: Diagnosis not present

## 2023-03-25 DIAGNOSIS — M9902 Segmental and somatic dysfunction of thoracic region: Secondary | ICD-10-CM | POA: Diagnosis not present

## 2023-03-25 DIAGNOSIS — M9903 Segmental and somatic dysfunction of lumbar region: Secondary | ICD-10-CM | POA: Diagnosis not present

## 2023-03-25 DIAGNOSIS — M9901 Segmental and somatic dysfunction of cervical region: Secondary | ICD-10-CM | POA: Diagnosis not present

## 2023-03-25 DIAGNOSIS — M4727 Other spondylosis with radiculopathy, lumbosacral region: Secondary | ICD-10-CM | POA: Diagnosis not present

## 2023-04-08 DIAGNOSIS — M9903 Segmental and somatic dysfunction of lumbar region: Secondary | ICD-10-CM | POA: Diagnosis not present

## 2023-04-08 DIAGNOSIS — M9902 Segmental and somatic dysfunction of thoracic region: Secondary | ICD-10-CM | POA: Diagnosis not present

## 2023-04-08 DIAGNOSIS — M9905 Segmental and somatic dysfunction of pelvic region: Secondary | ICD-10-CM | POA: Diagnosis not present

## 2023-04-08 DIAGNOSIS — M9901 Segmental and somatic dysfunction of cervical region: Secondary | ICD-10-CM | POA: Diagnosis not present

## 2023-04-08 DIAGNOSIS — M7912 Myalgia of auxiliary muscles, head and neck: Secondary | ICD-10-CM | POA: Diagnosis not present

## 2023-04-08 DIAGNOSIS — M461 Sacroiliitis, not elsewhere classified: Secondary | ICD-10-CM | POA: Diagnosis not present

## 2023-04-08 DIAGNOSIS — M4727 Other spondylosis with radiculopathy, lumbosacral region: Secondary | ICD-10-CM | POA: Diagnosis not present

## 2023-04-15 DIAGNOSIS — I4891 Unspecified atrial fibrillation: Secondary | ICD-10-CM | POA: Diagnosis not present

## 2023-04-15 DIAGNOSIS — Z6835 Body mass index (BMI) 35.0-35.9, adult: Secondary | ICD-10-CM | POA: Diagnosis not present

## 2023-04-15 DIAGNOSIS — J449 Chronic obstructive pulmonary disease, unspecified: Secondary | ICD-10-CM | POA: Diagnosis not present

## 2023-04-15 DIAGNOSIS — Z139 Encounter for screening, unspecified: Secondary | ICD-10-CM | POA: Diagnosis not present

## 2023-04-15 DIAGNOSIS — R739 Hyperglycemia, unspecified: Secondary | ICD-10-CM | POA: Diagnosis not present

## 2023-04-15 DIAGNOSIS — E785 Hyperlipidemia, unspecified: Secondary | ICD-10-CM | POA: Diagnosis not present

## 2023-04-15 DIAGNOSIS — Z79899 Other long term (current) drug therapy: Secondary | ICD-10-CM | POA: Diagnosis not present

## 2023-04-15 DIAGNOSIS — I5032 Chronic diastolic (congestive) heart failure: Secondary | ICD-10-CM | POA: Diagnosis not present

## 2023-04-15 DIAGNOSIS — Z9181 History of falling: Secondary | ICD-10-CM | POA: Diagnosis not present

## 2023-04-22 DIAGNOSIS — M9905 Segmental and somatic dysfunction of pelvic region: Secondary | ICD-10-CM | POA: Diagnosis not present

## 2023-04-22 DIAGNOSIS — M9901 Segmental and somatic dysfunction of cervical region: Secondary | ICD-10-CM | POA: Diagnosis not present

## 2023-04-22 DIAGNOSIS — M7912 Myalgia of auxiliary muscles, head and neck: Secondary | ICD-10-CM | POA: Diagnosis not present

## 2023-04-22 DIAGNOSIS — M9902 Segmental and somatic dysfunction of thoracic region: Secondary | ICD-10-CM | POA: Diagnosis not present

## 2023-04-22 DIAGNOSIS — M461 Sacroiliitis, not elsewhere classified: Secondary | ICD-10-CM | POA: Diagnosis not present

## 2023-04-22 DIAGNOSIS — M9903 Segmental and somatic dysfunction of lumbar region: Secondary | ICD-10-CM | POA: Diagnosis not present

## 2023-04-22 DIAGNOSIS — M4727 Other spondylosis with radiculopathy, lumbosacral region: Secondary | ICD-10-CM | POA: Diagnosis not present

## 2023-05-08 DIAGNOSIS — M4727 Other spondylosis with radiculopathy, lumbosacral region: Secondary | ICD-10-CM | POA: Diagnosis not present

## 2023-05-08 DIAGNOSIS — M7912 Myalgia of auxiliary muscles, head and neck: Secondary | ICD-10-CM | POA: Diagnosis not present

## 2023-05-08 DIAGNOSIS — M9905 Segmental and somatic dysfunction of pelvic region: Secondary | ICD-10-CM | POA: Diagnosis not present

## 2023-05-08 DIAGNOSIS — M9901 Segmental and somatic dysfunction of cervical region: Secondary | ICD-10-CM | POA: Diagnosis not present

## 2023-05-08 DIAGNOSIS — M461 Sacroiliitis, not elsewhere classified: Secondary | ICD-10-CM | POA: Diagnosis not present

## 2023-05-08 DIAGNOSIS — M9903 Segmental and somatic dysfunction of lumbar region: Secondary | ICD-10-CM | POA: Diagnosis not present

## 2023-05-08 DIAGNOSIS — M9902 Segmental and somatic dysfunction of thoracic region: Secondary | ICD-10-CM | POA: Diagnosis not present

## 2023-05-20 DIAGNOSIS — M461 Sacroiliitis, not elsewhere classified: Secondary | ICD-10-CM | POA: Diagnosis not present

## 2023-05-20 DIAGNOSIS — M9901 Segmental and somatic dysfunction of cervical region: Secondary | ICD-10-CM | POA: Diagnosis not present

## 2023-05-20 DIAGNOSIS — M9905 Segmental and somatic dysfunction of pelvic region: Secondary | ICD-10-CM | POA: Diagnosis not present

## 2023-05-20 DIAGNOSIS — M7912 Myalgia of auxiliary muscles, head and neck: Secondary | ICD-10-CM | POA: Diagnosis not present

## 2023-05-20 DIAGNOSIS — M4727 Other spondylosis with radiculopathy, lumbosacral region: Secondary | ICD-10-CM | POA: Diagnosis not present

## 2023-05-20 DIAGNOSIS — M9902 Segmental and somatic dysfunction of thoracic region: Secondary | ICD-10-CM | POA: Diagnosis not present

## 2023-05-20 DIAGNOSIS — M9903 Segmental and somatic dysfunction of lumbar region: Secondary | ICD-10-CM | POA: Diagnosis not present

## 2023-06-11 DIAGNOSIS — M25561 Pain in right knee: Secondary | ICD-10-CM | POA: Insufficient documentation

## 2023-06-11 DIAGNOSIS — R600 Localized edema: Secondary | ICD-10-CM | POA: Insufficient documentation

## 2023-06-11 DIAGNOSIS — K429 Umbilical hernia without obstruction or gangrene: Secondary | ICD-10-CM | POA: Insufficient documentation

## 2023-06-11 DIAGNOSIS — M48061 Spinal stenosis, lumbar region without neurogenic claudication: Secondary | ICD-10-CM | POA: Insufficient documentation

## 2023-06-11 NOTE — Progress Notes (Unsigned)
Cardiology Office Note:    Date:  06/12/2023   ID:  Christian Rojas, DOB 04/04/1946, MRN [id number]  PCP:  Wilmer Floor., MD  Cardiologist:  Norman Herrlich, MD    Referring MD: Wilmer Floor., MD    ASSESSMENT:    1. Permanent atrial fibrillation (HCC)   2. Chronic anticoagulation   3. Hypertensive heart disease without heart failure   4. OSA (obstructive sleep apnea)    PLAN:    In order of problems listed above:  Atrial fibrillation he is doing well continue his low-dose beta-blocker current anticoagulant Told him at this time he is not a candidate for Watchman device remain on Xarelto Nicely compensated continue his loop diuretic Stable and treated   Next appointment: 6 months   Medication Adjustments/Labs and Tests Ordered: Current medicines are reviewed at length with the patient today.  Concerns regarding medicines are outlined above.  No orders of the defined types were placed in this encounter.  No orders of the defined types were placed in this encounter.    History of Present Illness:    Christian Rojas is a 77 y.o. male with a hx of persistent atrial fibrillation with chronic anticoagulation hypertensive heart disease without heart failure and obstructive sleep apnea last seen 08/26/2022.  Echocardiograms at the [facility] most recently November 2022 showed LVH normal ejection fraction moderate mitral annular calcification mild mitral regurgitation aortic valve calcification without stenosis.  Compliance with diet, lifestyle and medications: Yes  Inquires again about watchman as an alternative to anticoagulants unfortunately at this time he does not have an indication. He is unwell is been active no edema shortness of breath chest pain palpitation or syncope He bruises he sleep but no bleeding clinically Unfortunately he [place] became sick soon afterwards and is having diarrhea Past Medical History:  Diagnosis Date    Atrial fibrillation (HCC)    COPD (chronic obstructive pulmonary disease) (HCC)    Dyspnea    Hypertension    Low back pain    OSA (obstructive sleep apnea)     Current Medications: Current Meds  Medication Sig   atenolol (TENORMIN) 25 MG tablet Take 12.5 mg by mouth daily.   BREO ELLIPTA 100-25 MCG/ACT AEPB Inhale 1 puff into the lungs daily.   furosemide (LASIX) 20 MG tablet Take 1 tablet (20 mg total) by mouth daily. Weigh daily and if weight increases 5 pounds take an extra tablet of Furosemide daily   rivaroxaban (XARELTO) 20 MG TABS tablet Take 20 mg by mouth daily with supper.      EKGs/Labs/Other Studies Reviewed:    The following studies were reviewed today: 04/15/2023: Cholesterol 161 LDL 103 A1c 6.2 hemoglobin 14.2 creatinine 1.06 potassium 4.4  Physical Exam:    VS:  BP 120/84 (BP Location: Left Arm, Patient Position: Sitting, Cuff Size: Normal)   Pulse 90   Ht 5\' 11"  (1.803 m)   Wt 255 lb (115.7 kg)   SpO2 93%   BMI 35.57 kg/m     Wt Readings from Last 3 Encounters:  06/12/23 255 lb (115.7 kg)  09/05/22 264 lb (119.7 kg)  05/02/22 266 lb (120.7 kg)     GEN:  Well nourished, well developed in no acute distress HEENT: Normal NECK: No JVD; No carotid bruits LYMPHATICS: No lymphadenopathy CARDIAC: Irregular rate and rhythm  RESPIRATORY:  Clear to auscultation without rales, wheezing or rhonchi  ABDOMEN: Soft, non-tender, non-distended MUSCULOSKELETAL:  No edema; No deformity  SKIN: Warm and dry NEUROLOGIC:  Alert and oriented x 3 PSYCHIATRIC:  Normal affect    Signed, Norman Herrlich, MD  06/12/2023 4:33 PM    [facility]

## 2023-06-12 ENCOUNTER — Ambulatory Visit: Payer: Medicare HMO | Attending: Cardiology | Admitting: Cardiology

## 2023-06-12 ENCOUNTER — Encounter: Payer: Self-pay | Admitting: Cardiology

## 2023-06-12 VITALS — BP 120/84 | HR 90 | Ht 71.0 in | Wt 255.0 lb

## 2023-06-12 DIAGNOSIS — G4733 Obstructive sleep apnea (adult) (pediatric): Secondary | ICD-10-CM | POA: Diagnosis not present

## 2023-06-12 DIAGNOSIS — Z7901 Long term (current) use of anticoagulants: Secondary | ICD-10-CM

## 2023-06-12 DIAGNOSIS — I119 Hypertensive heart disease without heart failure: Secondary | ICD-10-CM

## 2023-06-12 DIAGNOSIS — I4821 Permanent atrial fibrillation: Secondary | ICD-10-CM

## 2023-06-12 NOTE — Patient Instructions (Signed)

## 2023-06-17 DIAGNOSIS — M9903 Segmental and somatic dysfunction of lumbar region: Secondary | ICD-10-CM | POA: Diagnosis not present

## 2023-06-17 DIAGNOSIS — M9905 Segmental and somatic dysfunction of pelvic region: Secondary | ICD-10-CM | POA: Diagnosis not present

## 2023-06-17 DIAGNOSIS — M4727 Other spondylosis with radiculopathy, lumbosacral region: Secondary | ICD-10-CM | POA: Diagnosis not present

## 2023-06-17 DIAGNOSIS — M9902 Segmental and somatic dysfunction of thoracic region: Secondary | ICD-10-CM | POA: Diagnosis not present

## 2023-06-17 DIAGNOSIS — M461 Sacroiliitis, not elsewhere classified: Secondary | ICD-10-CM | POA: Diagnosis not present

## 2023-06-17 DIAGNOSIS — M9901 Segmental and somatic dysfunction of cervical region: Secondary | ICD-10-CM | POA: Diagnosis not present

## 2023-06-17 DIAGNOSIS — M7912 Myalgia of auxiliary muscles, head and neck: Secondary | ICD-10-CM | POA: Diagnosis not present

## 2023-07-15 DIAGNOSIS — M9903 Segmental and somatic dysfunction of lumbar region: Secondary | ICD-10-CM | POA: Diagnosis not present

## 2023-07-15 DIAGNOSIS — M9905 Segmental and somatic dysfunction of pelvic region: Secondary | ICD-10-CM | POA: Diagnosis not present

## 2023-07-15 DIAGNOSIS — M461 Sacroiliitis, not elsewhere classified: Secondary | ICD-10-CM | POA: Diagnosis not present

## 2023-07-15 DIAGNOSIS — M7912 Myalgia of auxiliary muscles, head and neck: Secondary | ICD-10-CM | POA: Diagnosis not present

## 2023-07-15 DIAGNOSIS — M9901 Segmental and somatic dysfunction of cervical region: Secondary | ICD-10-CM | POA: Diagnosis not present

## 2023-07-15 DIAGNOSIS — M4727 Other spondylosis with radiculopathy, lumbosacral region: Secondary | ICD-10-CM | POA: Diagnosis not present

## 2023-07-15 DIAGNOSIS — M9902 Segmental and somatic dysfunction of thoracic region: Secondary | ICD-10-CM | POA: Diagnosis not present

## 2023-08-07 DIAGNOSIS — M9905 Segmental and somatic dysfunction of pelvic region: Secondary | ICD-10-CM | POA: Diagnosis not present

## 2023-08-07 DIAGNOSIS — M9901 Segmental and somatic dysfunction of cervical region: Secondary | ICD-10-CM | POA: Diagnosis not present

## 2023-08-07 DIAGNOSIS — S161XXA Strain of muscle, fascia and tendon at neck level, initial encounter: Secondary | ICD-10-CM | POA: Diagnosis not present

## 2023-08-07 DIAGNOSIS — M4727 Other spondylosis with radiculopathy, lumbosacral region: Secondary | ICD-10-CM | POA: Diagnosis not present

## 2023-08-07 DIAGNOSIS — S134XXA Sprain of ligaments of cervical spine, initial encounter: Secondary | ICD-10-CM | POA: Diagnosis not present

## 2023-08-07 DIAGNOSIS — M9903 Segmental and somatic dysfunction of lumbar region: Secondary | ICD-10-CM | POA: Diagnosis not present

## 2023-08-07 DIAGNOSIS — M9902 Segmental and somatic dysfunction of thoracic region: Secondary | ICD-10-CM | POA: Diagnosis not present

## 2023-08-11 DIAGNOSIS — S161XXA Strain of muscle, fascia and tendon at neck level, initial encounter: Secondary | ICD-10-CM | POA: Diagnosis not present

## 2023-08-11 DIAGNOSIS — S134XXA Sprain of ligaments of cervical spine, initial encounter: Secondary | ICD-10-CM | POA: Diagnosis not present

## 2023-08-11 DIAGNOSIS — M9901 Segmental and somatic dysfunction of cervical region: Secondary | ICD-10-CM | POA: Diagnosis not present

## 2023-08-11 DIAGNOSIS — M9903 Segmental and somatic dysfunction of lumbar region: Secondary | ICD-10-CM | POA: Diagnosis not present

## 2023-08-11 DIAGNOSIS — M9905 Segmental and somatic dysfunction of pelvic region: Secondary | ICD-10-CM | POA: Diagnosis not present

## 2023-08-11 DIAGNOSIS — M9902 Segmental and somatic dysfunction of thoracic region: Secondary | ICD-10-CM | POA: Diagnosis not present

## 2023-08-11 DIAGNOSIS — M4727 Other spondylosis with radiculopathy, lumbosacral region: Secondary | ICD-10-CM | POA: Diagnosis not present

## 2023-08-12 DIAGNOSIS — M1711 Unilateral primary osteoarthritis, right knee: Secondary | ICD-10-CM | POA: Diagnosis not present

## 2023-08-12 DIAGNOSIS — S83241A Other tear of medial meniscus, current injury, right knee, initial encounter: Secondary | ICD-10-CM | POA: Diagnosis not present

## 2023-08-12 DIAGNOSIS — S83249A Other tear of medial meniscus, current injury, unspecified knee, initial encounter: Secondary | ICD-10-CM | POA: Insufficient documentation

## 2023-08-13 DIAGNOSIS — Z9181 History of falling: Secondary | ICD-10-CM | POA: Diagnosis not present

## 2023-08-13 DIAGNOSIS — Z0000 Encounter for general adult medical examination without abnormal findings: Secondary | ICD-10-CM | POA: Diagnosis not present

## 2023-09-22 DIAGNOSIS — S83241A Other tear of medial meniscus, current injury, right knee, initial encounter: Secondary | ICD-10-CM | POA: Diagnosis not present

## 2023-09-22 DIAGNOSIS — M1711 Unilateral primary osteoarthritis, right knee: Secondary | ICD-10-CM | POA: Diagnosis not present

## 2023-10-09 DIAGNOSIS — M1711 Unilateral primary osteoarthritis, right knee: Secondary | ICD-10-CM | POA: Diagnosis not present

## 2024-01-28 DIAGNOSIS — M9902 Segmental and somatic dysfunction of thoracic region: Secondary | ICD-10-CM | POA: Diagnosis not present

## 2024-01-28 DIAGNOSIS — M461 Sacroiliitis, not elsewhere classified: Secondary | ICD-10-CM | POA: Diagnosis not present

## 2024-01-28 DIAGNOSIS — M9901 Segmental and somatic dysfunction of cervical region: Secondary | ICD-10-CM | POA: Diagnosis not present

## 2024-01-28 DIAGNOSIS — M9905 Segmental and somatic dysfunction of pelvic region: Secondary | ICD-10-CM | POA: Diagnosis not present

## 2024-01-28 DIAGNOSIS — M4727 Other spondylosis with radiculopathy, lumbosacral region: Secondary | ICD-10-CM | POA: Diagnosis not present

## 2024-01-28 DIAGNOSIS — M9903 Segmental and somatic dysfunction of lumbar region: Secondary | ICD-10-CM | POA: Diagnosis not present

## 2024-03-09 ENCOUNTER — Ambulatory Visit: Payer: Medicare HMO | Admitting: Cardiology

## 2024-03-16 ENCOUNTER — Ambulatory Visit: Admitting: Cardiology

## 2024-03-16 ENCOUNTER — Encounter: Payer: Self-pay | Admitting: Cardiology

## 2024-03-16 ENCOUNTER — Ambulatory Visit: Attending: Cardiology | Admitting: Cardiology

## 2024-03-16 VITALS — BP 138/78 | HR 73 | Ht 71.0 in | Wt 263.6 lb

## 2024-03-16 DIAGNOSIS — R0609 Other forms of dyspnea: Secondary | ICD-10-CM

## 2024-03-16 DIAGNOSIS — I4821 Permanent atrial fibrillation: Secondary | ICD-10-CM

## 2024-03-16 DIAGNOSIS — I10 Essential (primary) hypertension: Secondary | ICD-10-CM | POA: Diagnosis not present

## 2024-03-16 DIAGNOSIS — Z01818 Encounter for other preprocedural examination: Secondary | ICD-10-CM

## 2024-03-16 DIAGNOSIS — I482 Chronic atrial fibrillation, unspecified: Secondary | ICD-10-CM

## 2024-03-16 DIAGNOSIS — M1711 Unilateral primary osteoarthritis, right knee: Secondary | ICD-10-CM | POA: Insufficient documentation

## 2024-03-16 DIAGNOSIS — Z01810 Encounter for preprocedural cardiovascular examination: Secondary | ICD-10-CM

## 2024-03-16 DIAGNOSIS — I5032 Chronic diastolic (congestive) heart failure: Secondary | ICD-10-CM

## 2024-03-16 DIAGNOSIS — G4733 Obstructive sleep apnea (adult) (pediatric): Secondary | ICD-10-CM | POA: Diagnosis not present

## 2024-03-16 NOTE — Addendum Note (Signed)
 Addended by: Shawnee Dellen D on: 03/16/2024 04:36 PM   Modules accepted: Orders

## 2024-03-16 NOTE — Progress Notes (Signed)
 Cardiology Office Note:    Date:  03/16/2024   ID:  Christian Rojas, DOB Dec 07, 1945, MRN [id number]  PCP:  Alonso Jan., MD  Cardiologist:  Ralene Burger, MD    Referring MD: Alonso Jan., MD   Chief Complaint  Patient presents with   Medication Management    History of Present Illness:    Christian Rojas is a 78 y.o. male past medical history significant for COPD, obstructive sleep apnea on CPAP mask, essential hypertension, permanent atrial fibrillation.  Comes today to my office for follow-up also he is scheduled to see surgeon for his knee replacement surgery in would like to be evaluated before the surgery from cardiac standpoint review.  He denies have any chest pain tightness squeezing pressure burning chest but the problem is he cannot perform his activities more than just activity of daily living because of knee pain.  He just came back from the beach has been sometime over there to walk around with a cane with some difficulties.  Past Medical History:  Diagnosis Date   Atrial fibrillation (HCC)    COPD (chronic obstructive pulmonary disease) (HCC)    Dyspnea    Hypertension    Low back pain    OSA (obstructive sleep apnea)     Past Surgical History:  Procedure Laterality Date   OTHER SURGICAL HISTORY Right    elbow repair   UMBILICAL HERNIA REPAIR      Current Medications: Current Meds  Medication Sig   atenolol (TENORMIN) 25 MG tablet Take 12.5 mg by mouth daily.   furosemide  (LASIX ) 20 MG tablet Take 1 tablet (20 mg total) by mouth daily. Weigh daily and if weight increases 5 pounds take an extra tablet of Furosemide  daily   rivaroxaban (XARELTO) 20 MG TABS tablet Take 20 mg by mouth daily with supper.     Allergies:   Patient has no known allergies.   Social History   Socioeconomic History   Marital status: Married    Spouse name: Not on file   Number of children: Not on file   Years of education: Not on file   Highest education level:  Not on file  Occupational History   Not on file  Tobacco Use   Smoking status: Former    Types: Cigarettes   Smokeless tobacco: Never  Substance and Sexual Activity   Alcohol use: Yes    Alcohol/week: 12.0 standard drinks of alcohol    Types: 12 Cans of beer per week   Drug use: Not on file   Sexual activity: Not on file  Other Topics Concern   Not on file  Social History Narrative   Not on file   Social Drivers of Health   Financial Resource Strain: Not on file  Food Insecurity: Not on file  Transportation Needs: Not on file  Physical Activity: Not on file  Stress: Not on file  Social Connections: Not on file     Family History: The patient's family history includes COPD in his father. ROS:   Please see the history of present illness.    All 14 point review of systems negative except as described per history of present illness  EKGs/Labs/Other Studies Reviewed:    EKG Interpretation Date/Time:  Tuesday Mar 16 2024 15:57:20 EDT Ventricular Rate:  73 PR Interval:    QRS Duration:  72 QT Interval:  378 QTC Calculation: 416 R Axis:   59  Text Interpretation: Atrial fibrillation Nonspecific ST and T wave abnormality Abnormal  ECG No previous ECGs available Confirmed by Ralene Burger [phone]) on 03/16/2024 4:05:07 PM    Recent Labs: No results found for requested labs within last 365 days.  Recent Lipid Panel No results found for: "CHOL", "TRIG", "HDL", "CHOLHDL", "VLDL", "LDLCALC", "LDLDIRECT"  Physical Exam:    VS:  BP 138/78 (BP Location: Right Arm, Patient Position: Sitting)   Pulse 73   Ht 5\' 11"  (1.803 m)   Wt 263 lb 9.6 oz (119.6 kg)   SpO2 99%   BMI 36.76 kg/m     Wt Readings from Last 3 Encounters:  03/16/24 263 lb 9.6 oz (119.6 kg)  06/12/23 255 lb (115.7 kg)  09/05/22 264 lb (119.7 kg)     GEN:  Well nourished, well developed in no acute distress HEENT: Normal NECK: No JVD; No carotid bruits LYMPHATICS: No lymphadenopathy CARDIAC:  Irregularly irregular, no murmurs, no rubs, no gallops RESPIRATORY:  Clear to auscultation without rales, wheezing or rhonchi  ABDOMEN: Soft, non-tender, non-distended MUSCULOSKELETAL:  No edema; No deformity  SKIN: Warm and dry LOWER EXTREMITIES: 1+ swelling NEUROLOGIC:  Alert and oriented x 3 PSYCHIATRIC:  Normal affect   ASSESSMENT:    1. Permanent atrial fibrillation (HCC)   2. Pre-op evaluation   3. Chronic atrial fibrillation (HCC)   4. Chronic diastolic heart failure (HCC)   5. Primary hypertension   6. OSA (obstructive sleep apnea)   7. Dyspnea on exertion    PLAN:    In order of problems listed above:  Cardiovascular preop evaluation for this gentleman with history of permanent atrial fibrillation.  His ability to exercise limited he does have swelling of lower extremities we need to do echocardiogram to assess left ventricle ejection fraction as well as to look for any evidence of coronary artery disease.  He clearly cannot do 4 months and does have significant risk factors for coronary artery disease. Permanent atrial fibrillation, rate controlled, anticoagulated with Xarelto.  Again he brought in the issue of Watchman device I told him no indication for his situation but in the future may be the case.  He complained of having some bruising I explained to him that this is small price for pain for prevention from having stroke.  He understands and he will continue. Obstructive sleep apnea use CPAP mask on the regular basis he is very happy about it and he is very satisfied with results of the treatment   Medication Adjustments/Labs and Tests Ordered: Current medicines are reviewed at length with the patient today.  Concerns regarding medicines are outlined above.  Orders Placed This Encounter  Procedures   MYOCARDIAL PERFUSION IMAGING   EKG 12-Lead   ECHOCARDIOGRAM COMPLETE   Medication changes: No orders of the defined types were placed in this  encounter.   Signed, Manfred Seed, MD, [facility] 03/16/2024 4:29 PM    [facility]

## 2024-03-16 NOTE — Patient Instructions (Signed)
 Medication Instructions:  Your physician recommends that you continue on your current medications as directed. Please refer to the Current Medication list given to you today.  *If you need a refill on your cardiac medications before your next appointment, please call your pharmacy*   Lab Work: None Ordered If you have labs (blood work) drawn today and your tests are completely normal, you will receive your results only by: MyChart Message (if you have MyChart) OR A paper copy in the mail If you have any lab test that is abnormal or we need to change your treatment, we will call you to review the results.   Testing/Procedures: Your physician has requested that you have an echocardiogram. Echocardiography is a painless test that uses sound waves to create images of your heart. It provides your doctor with information about the size and shape of your heart and how well your heart's chambers and valves are working. This procedure takes approximately one hour. There are no restrictions for this procedure. Please do NOT wear cologne, perfume, aftershave, or lotions (deodorant is allowed). Please arrive 15 minutes prior to your appointment time.  Please note: We ask at that you not bring children with you during ultrasound (echo/ vascular) testing. Due to room size and safety concerns, children are not allowed in the ultrasound rooms during exams. Our front office staff cannot provide observation of children in our lobby area while testing is being conducted. An adult accompanying a patient to their appointment will only be allowed in the ultrasound room at the discretion of the ultrasound technician under special circumstances. We apologize for any inconvenience.   Your physician has requested that you have a lexiscan myoview. For further information please visit [email or web address] Please follow instruction sheet, as given.  The test will take approximately 3 to 4 hours to complete; you may bring  reading material.  If someone comes with you to your appointment, they will need to remain in the main lobby due to limited space in the testing area.    How to prepare for your Myocardial Perfusion Test: Do not eat or drink 3 hours prior to your test, except you may have water. Do not consume products containing caffeine (regular or decaffeinated) 12 hours prior to your test. (ex: coffee, chocolate, sodas, tea). Do bring a list of your current medications with you.  If not listed below, you may take your medications as normal. Do wear comfortable clothes (no dresses or overalls) and walking shoes, tennis shoes preferred (No heels or open toe shoes are allowed). Do NOT wear cologne, perfume, aftershave, or lotions (deodorant is allowed). If these instructions are not followed, your test will have to be rescheduled.     Follow-Up: At [facility], you and your health needs are our priority.  As part of our continuing mission to provide you with exceptional heart care, we have created designated Provider Care Teams.  These Care Teams include your primary Cardiologist (physician) and Advanced Practice Providers (APPs -  Physician Assistants and Nurse Practitioners) who all work together to provide you with the care you need, when you need it.  We recommend signing up for the patient portal called "MyChart".  Sign up information is provided on this After Visit Summary.  MyChart is used to connect with patients for Virtual Visits (Telemedicine).  Patients are able to view lab/test results, encounter notes, upcoming appointments, etc.  Non-urgent messages can be sent to your provider as well.   To learn more about what you  can do with MyChart, go to [email or web address].    Your next appointment:   12 month(s)  The format for your next appointment:   In Person  Provider:   Ralene Burger, MD    Other Instructions NA

## 2024-03-24 NOTE — Addendum Note (Signed)
 Addended by: Ralene Burger on: 03/24/2024 08:12 AM   Modules accepted: Orders

## 2024-03-25 ENCOUNTER — Telehealth (HOSPITAL_COMMUNITY): Payer: Self-pay | Admitting: *Deleted

## 2024-03-25 NOTE — Telephone Encounter (Signed)
 Pt given instructions for MPI Study.

## 2024-03-26 ENCOUNTER — Other Ambulatory Visit: Payer: Self-pay | Admitting: Cardiology

## 2024-03-26 DIAGNOSIS — Z01818 Encounter for other preprocedural examination: Secondary | ICD-10-CM

## 2024-03-31 ENCOUNTER — Ambulatory Visit (HOSPITAL_COMMUNITY)
Admission: RE | Admit: 2024-03-31 | Discharge: 2024-03-31 | Disposition: A | Discharge status: Home / Self Care | Source: Ambulatory Visit | Attending: Cardiovascular Disease | Admitting: Cardiovascular Disease

## 2024-03-31 DIAGNOSIS — G4730 Sleep apnea, unspecified: Secondary | ICD-10-CM | POA: Insufficient documentation

## 2024-03-31 DIAGNOSIS — Z01818 Encounter for other preprocedural examination: Secondary | ICD-10-CM | POA: Diagnosis not present

## 2024-03-31 DIAGNOSIS — I3481 Nonrheumatic mitral (valve) annulus calcification: Secondary | ICD-10-CM | POA: Insufficient documentation

## 2024-03-31 DIAGNOSIS — Z01810 Encounter for preprocedural cardiovascular examination: Secondary | ICD-10-CM

## 2024-03-31 DIAGNOSIS — J449 Chronic obstructive pulmonary disease, unspecified: Secondary | ICD-10-CM | POA: Insufficient documentation

## 2024-03-31 DIAGNOSIS — Z87891 Personal history of nicotine dependence: Secondary | ICD-10-CM | POA: Insufficient documentation

## 2024-03-31 DIAGNOSIS — I4891 Unspecified atrial fibrillation: Secondary | ICD-10-CM | POA: Insufficient documentation

## 2024-03-31 DIAGNOSIS — I509 Heart failure, unspecified: Secondary | ICD-10-CM | POA: Diagnosis not present

## 2024-03-31 DIAGNOSIS — I77810 Thoracic aortic ectasia: Secondary | ICD-10-CM | POA: Diagnosis not present

## 2024-03-31 LAB — ECHOCARDIOGRAM COMPLETE
Area-P 1/2: 3.06 cm2
S' Lateral: 2.78 cm

## 2024-04-01 ENCOUNTER — Ambulatory Visit (HOSPITAL_COMMUNITY)
Admission: RE | Admit: 2024-04-01 | Discharge: 2024-04-01 | Disposition: A | Discharge status: Home / Self Care | Source: Ambulatory Visit | Attending: Cardiology | Admitting: Cardiology

## 2024-04-01 DIAGNOSIS — R0600 Dyspnea, unspecified: Secondary | ICD-10-CM | POA: Insufficient documentation

## 2024-04-01 DIAGNOSIS — I700 Atherosclerosis of aorta: Secondary | ICD-10-CM | POA: Insufficient documentation

## 2024-04-01 DIAGNOSIS — R911 Solitary pulmonary nodule: Secondary | ICD-10-CM | POA: Diagnosis not present

## 2024-04-01 DIAGNOSIS — I080 Rheumatic disorders of both mitral and aortic valves: Secondary | ICD-10-CM | POA: Insufficient documentation

## 2024-04-01 DIAGNOSIS — Z01818 Encounter for other preprocedural examination: Secondary | ICD-10-CM | POA: Diagnosis not present

## 2024-04-01 DIAGNOSIS — I10 Essential (primary) hypertension: Secondary | ICD-10-CM | POA: Diagnosis not present

## 2024-04-01 DIAGNOSIS — I4891 Unspecified atrial fibrillation: Secondary | ICD-10-CM | POA: Insufficient documentation

## 2024-04-01 LAB — MYOCARDIAL PERFUSION IMAGING
LV dias vol: 112 mL (ref 62–150)
LV sys vol: 38 mL (ref 4.2–5.8)
Nuc Stress EF: 66 %
Peak HR: 68 {beats}/min
Rest HR: 58 {beats}/min
Rest Nuclear Isotope Dose: 11.7 mCi
SDS: 0
SRS: 7
SSS: 2
ST Depression (mm): 0 mm
TID: 1.04

## 2024-04-01 MED ORDER — TECHNETIUM TC 99M TETROFOSMIN IV KIT
38.0000 | PACK | Freq: Once | INTRAVENOUS | Status: AC | PRN
  Administered 2024-04-01: 38 via INTRAVENOUS

## 2024-04-01 MED ORDER — TECHNETIUM TC 99M TETROFOSMIN IV KIT
11.7000 | PACK | Freq: Once | INTRAVENOUS | Status: AC | PRN
  Administered 2024-04-01: 11.7 via INTRAVENOUS

## 2024-04-01 MED ORDER — REGADENOSON 0.4 MG/5ML IV SOLN
INTRAVENOUS | Status: AC
  Filled 2024-04-01: qty 5

## 2024-04-01 MED ORDER — REGADENOSON 0.4 MG/5ML IV SOLN
0.4000 mg | Freq: Once | INTRAVENOUS | Status: AC
  Administered 2024-04-01: 0.4 mg via INTRAVENOUS

## 2024-04-02 ENCOUNTER — Ambulatory Visit: Payer: Self-pay | Admitting: Cardiology

## 2024-04-15 DIAGNOSIS — I5032 Chronic diastolic (congestive) heart failure: Secondary | ICD-10-CM | POA: Diagnosis not present

## 2024-04-15 DIAGNOSIS — I4891 Unspecified atrial fibrillation: Secondary | ICD-10-CM | POA: Diagnosis not present

## 2024-04-15 DIAGNOSIS — E785 Hyperlipidemia, unspecified: Secondary | ICD-10-CM | POA: Diagnosis not present

## 2024-04-15 DIAGNOSIS — Z6837 Body mass index (BMI) 37.0-37.9, adult: Secondary | ICD-10-CM | POA: Diagnosis not present

## 2024-04-15 DIAGNOSIS — Z79899 Other long term (current) drug therapy: Secondary | ICD-10-CM | POA: Diagnosis not present

## 2024-04-15 DIAGNOSIS — J449 Chronic obstructive pulmonary disease, unspecified: Secondary | ICD-10-CM | POA: Diagnosis not present

## 2024-04-15 DIAGNOSIS — R739 Hyperglycemia, unspecified: Secondary | ICD-10-CM | POA: Diagnosis not present

## 2024-04-19 ENCOUNTER — Encounter: Payer: Self-pay | Admitting: Cardiology

## 2024-04-19 ENCOUNTER — Ambulatory Visit: Attending: Cardiology | Admitting: Cardiology

## 2024-04-19 VITALS — BP 138/80 | HR 70 | Ht 71.5 in | Wt 265.8 lb

## 2024-04-19 DIAGNOSIS — I10 Essential (primary) hypertension: Secondary | ICD-10-CM

## 2024-04-19 DIAGNOSIS — R9439 Abnormal result of other cardiovascular function study: Secondary | ICD-10-CM | POA: Diagnosis not present

## 2024-04-19 DIAGNOSIS — I5032 Chronic diastolic (congestive) heart failure: Secondary | ICD-10-CM

## 2024-04-19 DIAGNOSIS — M9902 Segmental and somatic dysfunction of thoracic region: Secondary | ICD-10-CM | POA: Diagnosis not present

## 2024-04-19 DIAGNOSIS — M9901 Segmental and somatic dysfunction of cervical region: Secondary | ICD-10-CM | POA: Diagnosis not present

## 2024-04-19 DIAGNOSIS — I482 Chronic atrial fibrillation, unspecified: Secondary | ICD-10-CM | POA: Diagnosis not present

## 2024-04-19 DIAGNOSIS — G4733 Obstructive sleep apnea (adult) (pediatric): Secondary | ICD-10-CM | POA: Diagnosis not present

## 2024-04-19 DIAGNOSIS — J410 Simple chronic bronchitis: Secondary | ICD-10-CM | POA: Diagnosis not present

## 2024-04-19 DIAGNOSIS — M9903 Segmental and somatic dysfunction of lumbar region: Secondary | ICD-10-CM | POA: Diagnosis not present

## 2024-04-19 DIAGNOSIS — M9905 Segmental and somatic dysfunction of pelvic region: Secondary | ICD-10-CM | POA: Diagnosis not present

## 2024-04-19 DIAGNOSIS — M4727 Other spondylosis with radiculopathy, lumbosacral region: Secondary | ICD-10-CM | POA: Diagnosis not present

## 2024-04-19 DIAGNOSIS — M461 Sacroiliitis, not elsewhere classified: Secondary | ICD-10-CM | POA: Diagnosis not present

## 2024-04-19 MED ORDER — ISOSORBIDE MONONITRATE ER 30 MG PO TB24: 30.0000 mg | ORAL_TABLET | Freq: Every day | ORAL | 3 refills | Status: DC

## 2024-04-19 NOTE — Patient Instructions (Signed)
Medication Instructions:   START: Imdur 30mg  1 tablet daily   Lab Work: None Ordered If you have labs (blood work) drawn today and your tests are completely normal, you will receive your results only by: MyChart Message (if you have MyChart) OR A paper copy in the mail If you have any lab test that is abnormal or we need to change your treatment, we will call you to review the results.   Testing/Procedures: None Ordered   Follow-Up: At [facility], you and your health needs are our priority.  As part of our continuing mission to provide you with exceptional heart care, we have created designated Provider Care Teams.  These Care Teams include your primary Cardiologist (physician) and Advanced Practice Providers (APPs -  Physician Assistants and Nurse Practitioners) who all work together to provide you with the care you need, when you need it.  We recommend signing up for the patient portal called "MyChart".  Sign up information is provided on this After Visit Summary.  MyChart is used to connect with patients for Virtual Visits (Telemedicine).  Patients are able to view lab/test results, encounter notes, upcoming appointments, etc.  Non-urgent messages can be sent to your provider as well.   To learn more about what you can do with MyChart, go to [email or web address].    Your next appointment:   2 month(s)  The format for your next appointment:   In Person  Provider:   Gypsy Balsam, MD    Other Instructions NA

## 2024-04-19 NOTE — Progress Notes (Unsigned)
 Cardiology Office Note:    Date:  04/19/2024   ID:  Vincent Streater, DOB [birth year], MRN [id number]  PCP:  Elaine Garnette BIRCH., MD  Cardiologist:  Lamar Fitch, MD    Referring MD: Elaine Garnette BIRCH., MD   Chief Complaint  Patient presents with   Results    History of Present Illness:    Christian Rojas is a 78 y.o. male past medical history significant for permanent atrial fibrillation, anticoagulated, rate control, COPD, obstructive sleep apnea, mild pulmonary hypertension, he was sent to me for evaluation before elective knee replacement surgery, evaluation included echocardiogram which showed normal left ventricle ejection fraction mild pulmonary hypertension, however, nuclear stress test showed possibility of ischemia in LAD territory.  He comes to talk about it.  He does not have any symptoms but at the same time he does not walk much because of pain in the knee.  Past Medical History:  Diagnosis Date   Atrial fibrillation (HCC)    COPD (chronic obstructive pulmonary disease) (HCC)    Dyspnea    Hypertension    Low back pain    OSA (obstructive sleep apnea)     Past Surgical History:  Procedure Laterality Date   OTHER SURGICAL HISTORY Right    elbow repair   UMBILICAL HERNIA REPAIR      Current Medications: Current Meds  Medication Sig   atenolol (TENORMIN) 25 MG tablet Take 12.5 mg by mouth daily.   furosemide  (LASIX ) 20 MG tablet Take 1 tablet (20 mg total) by mouth daily. Weigh daily and if weight increases 5 pounds take an extra tablet of Furosemide  daily   rivaroxaban (XARELTO) 20 MG TABS tablet Take 20 mg by mouth daily with supper.     Allergies:   Patient has no known allergies.   Social History   Socioeconomic History   Marital status: Married    Spouse name: Not on file   Number of children: Not on file   Years of education: Not on file   Highest education level: Not on file  Occupational History   Not on file  Tobacco Use   Smoking status:  Former    Types: Cigarettes   Smokeless tobacco: Never  Substance and Sexual Activity   Alcohol use: Yes    Alcohol/week: 12.0 standard drinks of alcohol    Types: 12 Cans of beer per week   Drug use: Not on file   Sexual activity: Not on file  Other Topics Concern   Not on file  Social History Narrative   Not on file   Social Drivers of Health   Financial Resource Strain: Not on file  Food Insecurity: Not on file  Transportation Needs: Not on file  Physical Activity: Not on file  Stress: Not on file  Social Connections: Not on file     Family History: The patient's family history includes COPD in his father. ROS:   Please see the history of present illness.    All 14 point review of systems negative except as described per history of present illness  EKGs/Labs/Other Studies Reviewed:    EKG Interpretation Date/Time:  Monday April 19 2024 10:22:47 EDT Ventricular Rate:  70 PR Interval:    QRS Duration:  78 QT Interval:  378 QTC Calculation: 408 R Axis:   80  Text Interpretation: Atrial fibrillation Abnormal ECG When compared with ECG of 16-Mar-2024 15:57, No significant change was found Confirmed by Fitch Lamar [phone]) on 04/19/2024 10:41:03 AM    Recent Labs:  No results found for requested labs within last 365 days.  Recent Lipid Panel No results found for: CHOL, TRIG, HDL, CHOLHDL, VLDL, LDLCALC, LDLDIRECT  Physical Exam:    VS:  BP 138/80 (BP Location: Right Arm, Patient Position: Sitting)   Pulse 70   Ht 5' 11.5 (1.816 m)   Wt 265 lb 12.8 oz (120.6 kg)   SpO2 95%   BMI 36.55 kg/m     Wt Readings from Last 3 Encounters:  04/19/24 265 lb 12.8 oz (120.6 kg)  03/16/24 263 lb 9.6 oz (119.6 kg)  06/12/23 255 lb (115.7 kg)     GEN:  Well nourished, well developed in no acute distress HEENT: Normal NECK: No JVD; No carotid bruits LYMPHATICS: No lymphadenopathy CARDIAC: Irregular irregular, no murmurs, no rubs, no  gallops RESPIRATORY:  Clear to auscultation without rales, wheezing or rhonchi  ABDOMEN: Soft, non-tender, non-distended MUSCULOSKELETAL:  No edema; No deformity  SKIN: Warm and dry LOWER EXTREMITIES: no swelling NEUROLOGIC:  Alert and oriented x 3 PSYCHIATRIC:  Normal affect   ASSESSMENT:    1. Primary hypertension   2. Abnormal stress test   3. OSA (obstructive sleep apnea)   4. Simple chronic bronchitis (HCC)   5. Chronic diastolic heart failure (HCC)   6. Chronic atrial fibrillation (HCC)    PLAN:    In order of problems listed above:  Abnormal stress test with LAD ischemia, calcification noted in LAD as well as RCA.  We had a long discussion about what to do with the situation I recommended cardiac catheterization as definitely way to determine anatomy and significance of potential lesions, he said he want to think about it.  [facility] if [facility] will be able to [facility].  In the meantime he does not have any symptoms, I will put him on Imdur just in case that he gets some silent ischemia.  He will let us  know within a week if you want to pursue cardiac catheterization. Obstructive sleep apnea followed by antimedicine team. Dyslipidemia.  Will continue discussion about potentially starting statin with calcification of the coronary artery and abnormal stress test that would be good choice.   Medication Adjustments/Labs and Tests Ordered: Current medicines are reviewed at length with the patient today.  Concerns regarding medicines are outlined above.  Orders Placed This Encounter  Procedures   EKG 12-Lead   Medication changes: No orders of the defined types were placed in this encounter.   Signed, Lamar DOROTHA Fitch, MD, [facility] 04/19/2024 11:01 AM    [facility]

## 2024-05-13 ENCOUNTER — Telehealth: Payer: Self-pay

## 2024-05-13 NOTE — Telephone Encounter (Signed)
   Pre-operative Risk Assessment    Patient Name: Christian Rojas  DOB: 1946/03/22 MRN: [id number]   Date of last office visit: 04/19/2024 Date of next office visit: 06/30/2024   Request for Surgical Clearance    Procedure:  Right Total Knee Arthroplasty  Date of Surgery:  Clearance TBD                                 Surgeon:  Dr. Elspeth Her  Surgeon's Group or Practice Name:  [facility] Phone number:  [phone] Fax number:  [phone]   Type of Clearance Requested:   - Medical  - Pharmacy:  Hold not indicated      Type of Anesthesia:  Choice   Additional requests/questions:    Signed, Amadi Yoshino M Gradie Ohm   05/13/2024, 7:40 AM

## 2024-05-13 NOTE — Telephone Encounter (Signed)
   Name: Christian Rojas  DOB: Apr 02, 1946  MRN: [id number]  Primary Cardiologist: Lamar Fitch, MD  Chart reviewed as part of pre-operative protocol coverage. The patient has an upcoming visit scheduled with Dr. Krasowski on 06/30/2024 at which time clearance can be addressed in case there are any issues that would impact surgical recommendations.  Right total knee arthroplasty is not scheduled until TBD as below. I added preop FYI to appointment note so that provider is aware to address at time of outpatient visit.  Per office protocol the cardiology provider should forward their finalized clearance decision and recommendations regarding antiplatelet therapy to the requesting party below.    This message will also be routed to pharmacy pool for input on holding Xarelto as requested below so that this information is available to the clearing provider at time of patient's appointment.   I will route this message as FYI to requesting party and remove this message from the preop box as separate preop APP input not needed at this time.   Please call with any questions.  Lum LITTIE Louis, NP  05/13/2024, 11:58 AM

## 2024-05-17 NOTE — Telephone Encounter (Signed)
 Patient with diagnosis of atrial fibrillation on Xarelto for anticoagulation.    Procedure:  Right Total Knee Arthroplasty   Date of Surgery:  Clearance TBD     CHA2DS2-VASc Score = 4   This indicates a 4.8% annual risk of stroke. The patient's score is based upon: CHF History: 1 HTN History: 1 Diabetes History: 0 Stroke History: 0 Vascular Disease History: 0 Age Score: 2 Gender Score: 0   CrCl 119 Platelet count - needs updated lab  Patient has not had an Afib/aflutter ablation within the last 3 months or DCCV within the last 30 days   **This guidance is not considered finalized until pre-operative APP has relayed final recommendations.**

## 2024-06-17 NOTE — Telephone Encounter (Signed)
 Platelet count 204  Patient has not had an Afib/aflutter ablation within the last 3 months or DCCV within the last 30 days  Per office protocol, patient can hold Xarelto for 3 days prior to procedure.   Patient will not need bridging with Lovenox (enoxaparin) around procedure.  **This guidance is not considered finalized until pre-operative APP has relayed final recommendations.**

## 2024-06-30 ENCOUNTER — Ambulatory Visit: Admitting: Cardiology

## 2024-07-02 DIAGNOSIS — H40013 Open angle with borderline findings, low risk, bilateral: Secondary | ICD-10-CM | POA: Diagnosis not present

## 2024-07-02 DIAGNOSIS — H25813 Combined forms of age-related cataract, bilateral: Secondary | ICD-10-CM | POA: Diagnosis not present

## 2024-09-07 NOTE — H&P (Signed)
 Patient's anticipated LOS is less than 2 midnights, meeting these requirements: - Younger than 99 - Lives within 1 hour of care - Has a competent adult at home to recover with post-op recover - NO history of  - Chronic pain requiring opiods  - Diabetes  - Coronary Artery Disease  - Heart failure  - Heart attack  - Stroke  - DVT/VTE  - Cardiac arrhythmia  - Respiratory Failure/COPD  - Renal failure  - Anemia  - Advanced Liver disease     Christian Rojas is an 78 y.o. male.    Chief Complaint: right knee pain  HPI: Pt is a 78 y.o. male complaining of right knee pain for multiple years. Pain had continually increased since the beginning. X-rays in the clinic show end-stage arthritic changes of the right knee. Pt has tried various conservative treatments which have failed to alleviate their symptoms, including injections and therapy. Various options are discussed with the patient. Risks, benefits and expectations were discussed with the patient. Patient understand the risks, benefits and expectations and wishes to proceed with surgery.   PCP:  Elaine Garnette BIRCH., MD  D/C Plans: Home  PMH: Past Medical History:  Diagnosis Date   Atrial fibrillation (HCC)    COPD (chronic obstructive pulmonary disease) (HCC)    Dyspnea    Hypertension    Low back pain    OSA (obstructive sleep apnea)     PSH: Past Surgical History:  Procedure Laterality Date   OTHER SURGICAL HISTORY Right    elbow repair   UMBILICAL HERNIA REPAIR      Social History:  reports that he has quit smoking. His smoking use included cigarettes. He has never used smokeless tobacco. He reports current alcohol use of about 12.0 standard drinks of alcohol per week. No history on file for drug use. BMI: Estimated body mass index is 36.55 kg/m as calculated from the following:   Height as of 04/19/24: 5' 11.5 (1.816 m).   Weight as of 04/19/24: 120.6 kg.  No results found for: ALBUMIN Diabetes: Patient does  not have a diagnosis of diabetes.     Smoking Status:      Allergies:  No Known Allergies  Medications: No current facility-administered medications for this encounter.   Current Outpatient Medications  Medication Sig Dispense Refill   atenolol (TENORMIN) 25 MG tablet Take 12.5 mg by mouth daily.     furosemide  (LASIX ) 20 MG tablet Take 1 tablet (20 mg total) by mouth daily. Weigh daily and if weight increases 5 pounds take an extra tablet of Furosemide  daily 90 tablet 3   isosorbide  mononitrate (IMDUR ) 30 MG 24 hr tablet Take 1 tablet (30 mg total) by mouth daily. 90 tablet 3   rivaroxaban (XARELTO) 20 MG TABS tablet Take 20 mg by mouth daily with supper.      No results found for this or any previous visit (from the past 48 hours). No results found.  ROS: Pain with rom of the right lower extremity  Physical Exam: Alert and oriented 78 y.o. male in no acute distress Cranial nerves 2-12 intact Cervical spine: full rom with no tenderness, nv intact distally Chest: active breath sounds bilaterally, no wheeze rhonchi or rales Heart: regular rate and rhythm, no murmur Abd: non tender non distended with active bowel sounds Hip is stable with rom  Right knee painful rom with crepitus  Nv intact distally No rashes or edema distally  Assessment/Plan Assessment: right knee end stage osteoarthritis  Plan:  Patient will undergo a right total knee by Dr. Kay at [facility] Risks benefits and expectations were discussed with the patient. Patient understand risks, benefits and expectations and wishes to proceed. Preoperative templating of the joint replacement has been completed, documented, and submitted to the Operating Room personnel in order to optimize intra-operative equipment management.   Arvella Fireman PA-C, MPAS [facility] is now [employer] [address], [address], [place], [place] [address] Phone:  [phone] [email or web address] Facebook  [employer]

## 2024-09-17 NOTE — Progress Notes (Signed)
 COVID Vaccine received:  []  No [x]  Yes Date of any COVID positive Test in last 90 days: none  PCP -  Garnette Shams, MD  Also sees [facility]  Cardiologist - Redell Leiter, MD    Chest x-ray -  EKG -  04-19-24   Stress Test - 04-04-2024  Myoview   Epic ECHO - 03-31-2024 Cardiac Cath - [facility] at [place]  Dr. Ripley Grayce Darice Claudene MD  Report on Chart  CT Coronary Calcium score:   Pacemaker / ICD device [x]  No []  Yes   Spinal Cord Stimulator:[x]  No []  Yes       History of Sleep Apnea? []  No [x]  Yes   CPAP used?- []  No [x]  Yes    Medication on DOS: atenolol, Inhalers,   Hold DOS:  Patient has: [x]  NO Hx DM   []  Pre-DM   []  DM1  []   DM2 Does the patient monitor blood sugar?   []  N/A   []  No []  Yes   Blood Thinner / Instructions:  Xarleto  Hold x 72 hrs, No lovenox bridge,  Aspirin Instructions:  Activity level: Able to walk up 2 flights of stairs without becoming significantly short of breath or having chest pain?  [x]  No   []    Yes  Patient can perform ADLs without assistance. []  No   [x]   Yes  Anesthesia review: A. Fib, Nonobstructive CAD per [facility] 05-11-24, COPD, HTN, OSA- CPAP   , CHF, Tinnitus, Mild Pulm. HTN,     Patient denies any S&S of respiratory illness or Covid - no shortness of breath, fever, cough or chest pain at PAT appointment.  Patient verbalized understanding and agreement to the Pre-Surgical Instructions that were given to them at this PAT appointment. Patient was also educated of the need to review these PAT instructions again prior to his surgery.I reviewed the appropriate phone numbers to call if they have any and questions or concerns.

## 2024-09-17 NOTE — Patient Instructions (Addendum)
 SURGICAL WAITING ROOM VISITATION Patients having surgery or a procedure may have no more than 2 support people in the waiting area - these visitors may rotate in the visitor waiting room.   If the patient needs to stay at the hospital during part of their recovery, the visitor guidelines for inpatient rooms apply.  PRE-OP VISITATION  Pre-op nurse will coordinate an appropriate time for 1 support person to accompany the patient in pre-op.  This support person may not rotate.  This visitor will be contacted when the time is appropriate for the visitor to come back in the pre-op area.  Please refer to the [facility] website for the visitor guidelines for Inpatients (after your surgery is over and you are in a regular room).  You are not required to quarantine at this time prior to your surgery. However, you must do this: Hand Hygiene often Do NOT share personal items Notify your provider if you are in close contact with someone who has COVID or you develop fever 100.4 or greater, new onset of sneezing, cough, sore throat, shortness of breath or body aches.  If you test positive for Covid or have been in contact with anyone that has tested positive in the last 10 days please notify you surgeon.    Your procedure is scheduled on:  FRIDAY  September 24, 2024  Report to [facility] Main Entrance: [address] entrance where the [employer] is available.   Report to admitting at:  06:45   AM  Call this number if you have any questions or problems the morning of surgery [phone]  Do not eat food after Midnight the night prior to your surgery/procedure.  After Midnight you may have the following liquids until   06:15 AM DAY OF SURGERY  Clear Liquid Diet Water Black Coffee (sugar ok, NO MILK/CREAM OR CREAMERS)  Tea (sugar ok, NO MILK/CREAM OR CREAMERS) regular and decaf                             Plain Jell-O  with no fruit (NO RED)                                           Fruit  ices (not with fruit pulp, NO RED)                                     Popsicles (NO RED)                                                                  Juice: NO CITRUS JUICES: only apple, WHITE grape, WHITE cranberry Sports drinks like Gatorade or Powerade (NO RED)                   The day of surgery:  Drink ONE (1) Pre-Surgery Clear Ensure  at 06:15  AM the morning of surgery. Drink in one sitting. Do not sip.  This drink was given to you during your hospital pre-op appointment visit. Nothing else to drink  after completing the Pre-Surgery Clear Ensure : No candy, chewing gum or throat lozenges.    FOLLOW ANY ADDITIONAL PRE OP INSTRUCTIONS YOU RECEIVED FROM YOUR SURGEON'S OFFICE!!!   Oral Hygiene is also important to reduce your risk of infection.        Remember - BRUSH YOUR TEETH THE MORNING OF SURGERY WITH YOUR REGULAR TOOTHPASTE  Do NOT smoke after Midnight the night before surgery.  STOP TAKING all Vitamins, Herbs and supplements 1 week before your surgery.   XARELTO- Stop taking 72 hours before your surgery. Last dose will be taken on Monday 09-20-24  Take ONLY these medicines the morning of surgery with A SIP OF WATER: atenolol, and your inhalers if needed  If You have been diagnosed with Sleep Apnea - Bring CPAP mask and tubing day of surgery. We will provide you with a CPAP machine on the day of your surgery.                   You may not have any metal on your body including , jewelry, and body piercing  Do not wear lotions, powders, cologne, or deodorant  Men may shave face and neck.  Contacts, Hearing Aids, dentures or bridgework may not be worn into surgery. DENTURES WILL BE REMOVED PRIOR TO SURGERY PLEASE DO NOT APPLY Poly grip OR ADHESIVES!!!  You may bring a small overnight bag with you on the day of surgery, only pack items that are not valuable. [facility] IS NOT RESPONSIBLE   FOR VALUABLES THAT ARE LOST OR STOLEN.   Do not bring your home medications  to the hospital. The Pharmacy will dispense medications listed on your medication list to you during your admission in the Hospital.  Please read over the following fact sheets you were given: IF YOU HAVE QUESTIONS ABOUT YOUR PRE-OP INSTRUCTIONS, PLEASE CALL [phone].      Pre-operative 4 CHG Bath Instructions   You can play a key role in reducing the risk of infection after surgery. Your skin needs to be as free of germs as possible. You can reduce the number of germs on your skin by washing with CHG (chlorhexidine gluconate) soap before surgery. CHG is an antiseptic soap that kills germs and continues to kill germs even after washing.   DO NOT use if you have an allergy to chlorhexidine/CHG or antibacterial soaps. If your skin becomes reddened or irritated, stop using the CHG and notify one of our RNs at [phone]  Please shower with the CHG soap starting 4 days before surgery using the following schedule:  Monday  September 20, 2024     Do NOT use CHG soap                                                                                                                      the morning of your  surgery.         Please keep in mind the following:  DO NOT shave, including legs and underarms, starting the day of your first shower.   You may shave your face at any point before/day of surgery.  Place clean sheets on your bed the day you start using CHG soap. Use a clean washcloth (not used since being washed) for each shower. DO NOT sleep with pets once you start using the CHG.  CHG Shower Instructions:  If you choose to wash your hair and private area, wash first with your normal shampoo/soap.  After you use shampoo/soap, rinse your hair and body thoroughly to remove shampoo/soap residue.  Turn the water OFF and apply about 3 tablespoons (45 ml) of CHG soap to a  CLEAN washcloth.  Apply CHG soap ONLY FROM YOUR NECK DOWN TO YOUR TOES (washing for 3-5 minutes)  DO NOT use CHG soap on face, private areas, open wounds, or sores.  Pay special attention to the area where your surgery is being performed.  If you are having back surgery, having someone wash your back for you may be helpful. Wait 2 minutes after CHG soap is applied, then you may rinse off the CHG soap.  Pat dry with a clean towel  Put on clean clothes/pajamas   If you choose to wear lotion, please use ONLY the CHG-compatible lotions on the back of this paper.     Additional instructions for the day of surgery: DO NOT APPLY any CHG Soap,  lotions, deodorants, cologne, or perfumes on the day of surgery  Put on clean/comfortable clothes.  Brush your teeth.  Ask your nurse before applying any prescription medications to the skin.   CHG Compatible Lotions   Aveeno Moisturizing lotion  Cetaphil Moisturizing Cream  Cetaphil Moisturizing Lotion  Clairol Herbal Essence Moisturizing Lotion, Dry Skin  Clairol Herbal Essence Moisturizing Lotion, Extra Dry Skin  Clairol Herbal Essence Moisturizing Lotion, Normal Skin  Curel Age Defying Therapeutic Moisturizing Lotion with Alpha Hydroxy  Curel Extreme Care Body Lotion  Curel Soothing Hands Moisturizing Hand Lotion  Curel Therapeutic Moisturizing Cream, Fragrance-Free  Curel Therapeutic Moisturizing Lotion, Fragrance-Free  Curel Therapeutic Moisturizing Lotion, Original Formula  Eucerin Daily Replenishing Lotion  Eucerin Dry Skin Therapy Plus Alpha Hydroxy Crme  Eucerin Dry Skin Therapy Plus Alpha Hydroxy Lotion  Eucerin Original Crme  Eucerin Original Lotion  Eucerin Plus Crme Eucerin Plus Lotion  Eucerin TriLipid Replenishing Lotion  Keri Anti-Bacterial Hand Lotion  Keri Deep Conditioning Original Lotion Dry Skin Formula Softly Scented  Keri Deep Conditioning Original Lotion, Fragrance Free Sensitive Skin Formula  Keri Lotion Fast  Absorbing Fragrance Free Sensitive Skin Formula  Keri Lotion Fast Absorbing Softly Scented Dry Skin Formula  Keri Original Lotion  Keri Skin Renewal Lotion Keri Silky Smooth Lotion  Keri Silky Smooth Sensitive Skin Lotion  Nivea Body Creamy Conditioning Oil  Nivea Body Extra Enriched Lotion  Nivea Body Original Lotion  Nivea Body Sheer Moisturizing Lotion Nivea Crme  Nivea Skin Firming Lotion  NutraDerm 30 Skin Lotion  NutraDerm Skin Lotion  NutraDerm Therapeutic Skin Cream  NutraDerm Therapeutic Skin Lotion  ProShield Protective Hand Cream  Provon moisturizing lotion   FAILURE TO FOLLOW THESE INSTRUCTIONS MAY RESULT IN THE CANCELLATION OF YOUR SURGERY  PATIENT SIGNATURE_________________________________  NURSE SIGNATURE__________________________________  ________________________________________________________________________       Christian Rojas    An incentive spirometer is a tool that can help keep your lungs clear and active. This tool measures how  well you are filling your lungs with each breath. Taking long deep breaths may help reverse or decrease the chance of developing breathing (pulmonary) problems (especially infection) following: A long period of time when you are unable to move or be active. BEFORE THE PROCEDURE  If the spirometer includes an indicator to show your best effort, your nurse or respiratory therapist will set it to a desired goal. If possible, sit up straight or lean slightly forward. Try not to slouch. Hold the incentive spirometer in an upright position. INSTRUCTIONS FOR USE  Sit on the edge of your bed if possible, or sit up as far as you can in bed or on a chair. Hold the incentive spirometer in an upright position. Breathe out normally. Place the mouthpiece in your mouth and seal your lips tightly around it. Breathe in slowly and as deeply as possible, raising the piston or the ball toward the top of the column. Hold your breath for  3-5 seconds or for as long as possible. Allow the piston or ball to fall to the bottom of the column. Remove the mouthpiece from your mouth and breathe out normally. Rest for a few seconds and repeat Steps 1 through 7 at least 10 times every 1-2 hours when you are awake. Take your time and take a few normal breaths between deep breaths. The spirometer may include an indicator to show your best effort. Use the indicator as a goal to work toward during each repetition. After each set of 10 deep breaths, practice coughing to be sure your lungs are clear. If you have an incision (the cut made at the time of surgery), support your incision when coughing by placing a pillow or rolled up towels firmly against it. Once you are able to get out of bed, walk around indoors and cough well. You may stop using the incentive spirometer when instructed by your caregiver.  RISKS AND COMPLICATIONS Take your time so you do not get dizzy or light-headed. If you are in pain, you may need to take or ask for pain medication before doing incentive spirometry. It is harder to take a deep breath if you are having pain. AFTER USE Rest and breathe slowly and easily. It can be helpful to keep track of a log of your progress. Your caregiver can provide you with a simple table to help with this. If you are using the spirometer at home, follow these instructions: SEEK MEDICAL CARE IF:  You are having difficultly using the spirometer. You have trouble using the spirometer as often as instructed. Your pain medication is not giving enough relief while using the spirometer. You develop fever of 100.5 F (38.1 C) or higher.                                                                                                    SEEK IMMEDIATE MEDICAL CARE IF:  You cough up bloody sputum that had not been present before. You develop fever of 102 F (38.9 C) or greater. You develop worsening pain at or near the incision site. MAKE SURE  YOU:  Understand these instructions. Will watch your condition. Will get help right away if you are not doing well or get worse. Document Released: 02/17/2007 Document Revised: 12/30/2011 Document Reviewed: 04/20/2007 [facility] Patient Information 2014 [place], [place].      If you would like to see a video about joint replacement:   [email or web address]

## 2024-09-20 ENCOUNTER — Encounter (HOSPITAL_COMMUNITY): Payer: Self-pay

## 2024-09-20 ENCOUNTER — Other Ambulatory Visit: Payer: Self-pay

## 2024-09-20 ENCOUNTER — Encounter (HOSPITAL_COMMUNITY)
Admission: RE | Admit: 2024-09-20 | Discharge: 2024-09-20 | Disposition: A | Discharge status: Home / Self Care | Payer: Self-pay | Source: Ambulatory Visit | Attending: Orthopedic Surgery | Admitting: Orthopedic Surgery

## 2024-09-20 VITALS — BP 132/75 | HR 56 | Temp 98.6°F | Resp 16 | Ht 71.5 in | Wt 260.0 lb

## 2024-09-20 DIAGNOSIS — I4819 Other persistent atrial fibrillation: Secondary | ICD-10-CM | POA: Insufficient documentation

## 2024-09-20 DIAGNOSIS — I5032 Chronic diastolic (congestive) heart failure: Secondary | ICD-10-CM | POA: Insufficient documentation

## 2024-09-20 DIAGNOSIS — I2510 Atherosclerotic heart disease of native coronary artery without angina pectoris: Secondary | ICD-10-CM | POA: Diagnosis not present

## 2024-09-20 DIAGNOSIS — Z01818 Encounter for other preprocedural examination: Secondary | ICD-10-CM

## 2024-09-20 DIAGNOSIS — G4733 Obstructive sleep apnea (adult) (pediatric): Secondary | ICD-10-CM | POA: Diagnosis not present

## 2024-09-20 DIAGNOSIS — I110 Hypertensive heart disease with heart failure: Secondary | ICD-10-CM | POA: Diagnosis not present

## 2024-09-20 DIAGNOSIS — Z7901 Long term (current) use of anticoagulants: Secondary | ICD-10-CM | POA: Insufficient documentation

## 2024-09-20 DIAGNOSIS — Z87891 Personal history of nicotine dependence: Secondary | ICD-10-CM | POA: Insufficient documentation

## 2024-09-20 DIAGNOSIS — Z01812 Encounter for preprocedural laboratory examination: Secondary | ICD-10-CM | POA: Diagnosis present

## 2024-09-20 DIAGNOSIS — M1711 Unilateral primary osteoarthritis, right knee: Secondary | ICD-10-CM | POA: Diagnosis not present

## 2024-09-20 DIAGNOSIS — J449 Chronic obstructive pulmonary disease, unspecified: Secondary | ICD-10-CM | POA: Insufficient documentation

## 2024-09-20 HISTORY — DX: Cardiac arrhythmia, unspecified: I49.9

## 2024-09-20 HISTORY — DX: Myoneural disorder, unspecified: G70.9

## 2024-09-20 HISTORY — DX: Atherosclerotic heart disease of native coronary artery without angina pectoris: I25.10

## 2024-09-20 HISTORY — DX: Heart failure, unspecified: I50.9

## 2024-09-20 HISTORY — DX: Unspecified osteoarthritis, unspecified site: M19.90

## 2024-09-20 LAB — CBC
HCT: 44.5 % (ref 39.0–52.0)
Hemoglobin: 14.2 g/dL (ref 13.0–17.0)
MCH: 30.1 pg (ref 26.0–34.0)
MCHC: 31.9 g/dL (ref 30.0–36.0)
MCV: 94.3 fL (ref 80.0–100.0)
Platelets: 174 K/uL (ref 150–400)
RBC: 4.72 MIL/uL (ref 4.22–5.81)
RDW: 12.5 % (ref 11.5–15.5)
WBC: 6.6 K/uL (ref 4.0–10.5)
nRBC: 0 % (ref 0.0–0.2)

## 2024-09-20 LAB — SURGICAL PCR SCREEN
MRSA, PCR: NEGATIVE
Staphylococcus aureus: NEGATIVE

## 2024-09-20 LAB — BASIC METABOLIC PANEL WITH GFR
Anion gap: 11 (ref 5–15)
BUN: 12 mg/dL (ref 8–23)
CO2: 25 mmol/L (ref 22–32)
Calcium: 9.3 mg/dL (ref 8.9–10.3)
Chloride: 102 mmol/L (ref 98–111)
Creatinine, Ser: 1.06 mg/dL (ref 0.61–1.24)
GFR, Estimated: >60 mL/min (ref >60)
Glucose, Bld: 97 mg/dL (ref 70–99)
Potassium: 4.6 mmol/L (ref 3.5–5.1)
Sodium: 138 mmol/L (ref 135–145)

## 2024-09-21 NOTE — Anesthesia Preprocedure Evaluation (Addendum)
 Anesthesia Evaluation  Patient identified by MRN, date of birth, ID band Patient awake    Reviewed: Allergy & Precautions, H&P , NPO status , Patient's Chart, lab work & pertinent test results, reviewed documented beta blocker date and time   Airway Mallampati: III  TM Distance: >3 FB Neck ROM: Full    Dental no notable dental hx. (+) Teeth Intact, Dental Advisory Given   Pulmonary sleep apnea , COPD,  COPD inhaler, former smoker   Pulmonary exam normal breath sounds clear to auscultation       Cardiovascular hypertension, Pt. on medications and Pt. on home beta blockers + CAD and +CHF  + dysrhythmias Atrial Fibrillation  Rhythm:Irregular Rate:Normal     Neuro/Psych negative neurological ROS  negative psych ROS   GI/Hepatic negative GI ROS, Neg liver ROS,,,  Endo/Other  negative endocrine ROS    Renal/GU negative Renal ROS  negative genitourinary   Musculoskeletal  (+) Arthritis , Osteoarthritis,    Abdominal   Peds  Hematology negative hematology ROS (+)   Anesthesia Other Findings   Reproductive/Obstetrics negative OB ROS                              Anesthesia Physical Anesthesia Plan  ASA: 3  Anesthesia Plan: Spinal   Post-op Pain Management: Regional block* and Tylenol  PO (pre-op)*   Induction: Intravenous  PONV Risk Score and Plan: 2 and Ondansetron , Dexamethasone  and Propofol  infusion  Airway Management Planned: Natural Airway and Simple Face Mask  Additional Equipment:   Intra-op Plan:   Post-operative Plan:   Informed Consent: I have reviewed the patients History and Physical, chart, labs and discussed the procedure including the risks, benefits and alternatives for the proposed anesthesia with the patient or authorized representative who has indicated his/her understanding and acceptance.     Dental advisory given  Plan Discussed with: CRNA  Anesthesia Plan  Comments: (See PAT note 09/20/2024)         Anesthesia Quick Evaluation

## 2024-09-21 NOTE — Progress Notes (Signed)
 Anesthesia Chart Review   Case: [id number] Date/Time: 09/24/24 0900   Procedure: ARTHROPLASTY, KNEE, TOTAL (Right: Knee)   Anesthesia type: Choice   Diagnosis: Primary osteoarthritis of right knee [M17.11]   Pre-op diagnosis: Primary osteoarthritis of right knee   Location: [facility] ROOM 07 / [facility]   Surgeons: Kay Kemps, MD       DISCUSSION:78 y.o. former smoker with h/o HTN, sleep apnea uses CPAP, COPD, atrial fibrillation, CHF, right knee OA scheduled for above procedure 09/24/2024 with Dr. Kemps Kay.   At cardiology visit with Dr. Bernie 04/19/2024 cardiac cath discussed with patient due to abnormal stress test.  Pt ultimately went to the [facility] cardiologist for cardiac cath.   Cardiac cath  05/11/24 with ostial PDA with 80% stenosis, nonobstructive CAD in mLCx and pOM2.   Clearance received from [facility] cardiologist (under media tab). Per clearance, For preoperative testing he had a mildly positive stress test. This was followed by a left heart catheterization completed 05/11/2024. This noted mild to moderate nonobstructive disease. No further intervention was recommended at that time.  He can proceed with planned procedure without further preoperative cardiac testing. He can hold Xarelto for 3 days prior to procedure without bridging.  Last dose of Xarelto 09/20/2024.   VS: BP 132/75 Comment: right arm sitting  Pulse (!) 56   Temp 37 C (Oral)   Resp 16   Ht 5' 11.5 (1.816 m)   Wt 117.9 kg   SpO2 97%   BMI 35.76 kg/m   PROVIDERS: Elaine Garnette BIRCH., MD is PCP   Cardiologist - Redell Leiter, MD   LABS: Labs reviewed: Acceptable for surgery. (all labs ordered are listed, but only abnormal results are displayed)  Labs Reviewed  SURGICAL PCR SCREEN  BASIC METABOLIC PANEL WITH GFR  CBC     IMAGES:   EKG:   CV: Echo 03/31/24 1. Left ventricular ejection fraction, by estimation, is 60 to 65%. Left  ventricular ejection fraction by 3D volume is 65 %. The left ventricle  has  normal function. The left ventricle has no regional wall motion  abnormalities. Left ventricular diastolic   function could not be evaluated. The average left ventricular global  longitudinal strain is -19.8 %. The global longitudinal strain is normal.   2. Right ventricular systolic function is mildly reduced. The right  ventricular size is normal. There is mildly elevated pulmonary artery  systolic pressure. The estimated right ventricular systolic pressure is  42.7 mmHg.   3. Left atrial size was mildly dilated.   4. Right atrial size was moderately dilated.   5. The mitral valve is normal in structure. Trivial mitral valve  regurgitation. No evidence of mitral stenosis. Moderate mitral annular  calcification.   6. The aortic valve is normal in structure. Aortic valve regurgitation is  not visualized. No aortic stenosis is present.   7. Aortic dilatation noted. There is mild dilatation of the ascending  aorta, measuring 43 mm.   8. The inferior vena cava is normal in size with greater than 50%  respiratory variability, suggesting right atrial pressure of 3 mmHg.  Past Medical History:  Diagnosis Date   Arthritis    Atrial fibrillation (HCC)    CHF (congestive heart failure) (HCC)    Diastolic   COPD (chronic obstructive pulmonary disease) (HCC)    Coronary artery disease    Dyspnea    Dysrhythmia    A.FIB   Hypertension    Low back pain    Neuromuscular disorder (  HCC)    neuropathy BLE   OSA (obstructive sleep apnea)    wears CPAP    Past Surgical History:  Procedure Laterality Date   FRACTURE SURGERY Right 1952   UMBILICAL HERNIA REPAIR      MEDICATIONS:  rivaroxaban (XARELTO) 20 MG TABS tablet   rosuvastatin (CRESTOR) 10 MG tablet   Tiotropium Bromide-Olodaterol 2.5-2.5 MCG/ACT AERS   atenolol (TENORMIN) 25 MG tablet   isosorbide  mononitrate (IMDUR ) 30 MG 24 hr tablet   No current facility-administered medications for this encounter.   Harlene Hoots  Ward, PA-C [facility] Pre-Surgical Testing [phone]

## 2024-09-24 ENCOUNTER — Encounter (HOSPITAL_COMMUNITY): Admitting: Medical

## 2024-09-24 ENCOUNTER — Observation Stay (HOSPITAL_COMMUNITY)
Admission: RE | Admit: 2024-09-24 | Discharge: 2024-09-25 | Disposition: A | Discharge status: Home / Self Care | Source: Ambulatory Visit | Attending: Orthopedic Surgery | Admitting: Orthopedic Surgery

## 2024-09-24 ENCOUNTER — Other Ambulatory Visit: Payer: Self-pay

## 2024-09-24 ENCOUNTER — Ambulatory Visit (HOSPITAL_COMMUNITY): Admitting: Certified Registered Nurse Anesthetist

## 2024-09-24 ENCOUNTER — Encounter (HOSPITAL_COMMUNITY): Payer: Self-pay | Admitting: Orthopedic Surgery

## 2024-09-24 ENCOUNTER — Encounter (HOSPITAL_COMMUNITY): Admission: RE | Disposition: A | Payer: Self-pay | Source: Ambulatory Visit | Attending: Orthopedic Surgery

## 2024-09-24 DIAGNOSIS — I5030 Unspecified diastolic (congestive) heart failure: Secondary | ICD-10-CM | POA: Insufficient documentation

## 2024-09-24 DIAGNOSIS — Z79899 Other long term (current) drug therapy: Secondary | ICD-10-CM | POA: Insufficient documentation

## 2024-09-24 DIAGNOSIS — J449 Chronic obstructive pulmonary disease, unspecified: Secondary | ICD-10-CM | POA: Insufficient documentation

## 2024-09-24 DIAGNOSIS — M1711 Unilateral primary osteoarthritis, right knee: Principal | ICD-10-CM | POA: Insufficient documentation

## 2024-09-24 DIAGNOSIS — Z87891 Personal history of nicotine dependence: Secondary | ICD-10-CM | POA: Insufficient documentation

## 2024-09-24 DIAGNOSIS — I2510 Atherosclerotic heart disease of native coronary artery without angina pectoris: Secondary | ICD-10-CM | POA: Insufficient documentation

## 2024-09-24 DIAGNOSIS — Z96651 Presence of right artificial knee joint: Principal | ICD-10-CM

## 2024-09-24 DIAGNOSIS — I110 Hypertensive heart disease with heart failure: Secondary | ICD-10-CM | POA: Insufficient documentation

## 2024-09-24 HISTORY — PX: TOTAL KNEE ARTHROPLASTY: SHX125

## 2024-09-24 SURGERY — ARTHROPLASTY, KNEE, TOTAL
Anesthesia: Spinal | Site: Knee | Laterality: Right

## 2024-09-24 MED ORDER — ROSUVASTATIN CALCIUM 5 MG PO TABS: 5.0000 mg | ORAL_TABLET | Freq: Every day | ORAL | Status: DC

## 2024-09-24 MED ORDER — METOCLOPRAMIDE HCL 5 MG PO TABS: 5.0000 mg | ORAL_TABLET | Freq: Three times a day (TID) | ORAL | Status: DC | PRN

## 2024-09-24 MED ORDER — PROPOFOL 500 MG/50ML IV EMUL
INTRAVENOUS | Status: DC | PRN
  Administered 2024-09-24 (×2): 30 mg via INTRAVENOUS
  Administered 2024-09-24: 50 ug/kg/min via INTRAVENOUS

## 2024-09-24 MED ORDER — METOCLOPRAMIDE HCL 5 MG/ML IJ SOLN: 5.0000 mg | Freq: Three times a day (TID) | INTRAMUSCULAR | Status: DC | PRN

## 2024-09-24 MED ORDER — TRANEXAMIC ACID-NACL 1000-0.7 MG/100ML-% IV SOLN
1000.0000 mg | INTRAVENOUS | Status: AC
  Administered 2024-09-24: 1000 mg via INTRAVENOUS
  Filled 2024-09-24: qty 100

## 2024-09-24 MED ORDER — ONDANSETRON HCL 4 MG PO TABS: 4.0000 mg | ORAL_TABLET | Freq: Four times a day (QID) | ORAL | Status: DC | PRN

## 2024-09-24 MED ORDER — DOCUSATE SODIUM 100 MG PO CAPS
100.0000 mg | ORAL_CAPSULE | Freq: Two times a day (BID) | ORAL | Status: DC
  Administered 2024-09-24 – 2024-09-25 (×2): 100 mg via ORAL
  Filled 2024-09-24 (×2): qty 1

## 2024-09-24 MED ORDER — MENTHOL 3 MG MT LOZG: 1.0000 | LOZENGE | OROMUCOSAL | Status: DC | PRN

## 2024-09-24 MED ORDER — FENTANYL CITRATE (PF) 50 MCG/ML IJ SOSY
PREFILLED_SYRINGE | INTRAMUSCULAR | Status: AC
  Filled 2024-09-24: qty 1

## 2024-09-24 MED ORDER — FENTANYL CITRATE (PF) 50 MCG/ML IJ SOSY
25.0000 ug | PREFILLED_SYRINGE | INTRAMUSCULAR | Status: DC | PRN
  Administered 2024-09-24 (×3): 50 ug via INTRAVENOUS

## 2024-09-24 MED ORDER — PHENOL 1.4 % MT LIQD: 1.0000 | OROMUCOSAL | Status: DC | PRN

## 2024-09-24 MED ORDER — OXYCODONE HCL 5 MG PO TABS: 5.0000 mg | ORAL_TABLET | Freq: Four times a day (QID) | ORAL | 0 refills | Status: DC | PRN

## 2024-09-24 MED ORDER — CHLORHEXIDINE GLUCONATE 0.12 % MT SOLN
15.0000 mL | Freq: Once | OROMUCOSAL | Status: AC
  Administered 2024-09-24: 15 mL via OROMUCOSAL

## 2024-09-24 MED ORDER — HYDROMORPHONE HCL 1 MG/ML IJ SOLN
0.5000 mg | INTRAMUSCULAR | Status: DC | PRN
  Administered 2024-09-25: 0.5 mg via INTRAVENOUS
  Filled 2024-09-24: qty 1

## 2024-09-24 MED ORDER — METHOCARBAMOL 500 MG PO TABS: 500.0000 mg | ORAL_TABLET | Freq: Four times a day (QID) | ORAL | 1 refills | Status: AC | PRN

## 2024-09-24 MED ORDER — ROSUVASTATIN CALCIUM 5 MG PO TABS
5.0000 mg | ORAL_TABLET | Freq: Every day | ORAL | Status: DC
  Filled 2024-09-24: qty 1

## 2024-09-24 MED ORDER — ATENOLOL 25 MG PO TABS: 12.5000 mg | ORAL_TABLET | Freq: Every day | ORAL | Status: DC

## 2024-09-24 MED ORDER — 0.9 % SODIUM CHLORIDE (POUR BTL) OPTIME
TOPICAL | Status: DC | PRN
  Administered 2024-09-24: 1000 mL

## 2024-09-24 MED ORDER — PROPOFOL 1000 MG/100ML IV EMUL
INTRAVENOUS | Status: AC
  Filled 2024-09-24: qty 100

## 2024-09-24 MED ORDER — ACETAMINOPHEN 500 MG PO TABS
1000.0000 mg | ORAL_TABLET | Freq: Once | ORAL | Status: AC
  Administered 2024-09-24: 1000 mg via ORAL
  Filled 2024-09-24: qty 2

## 2024-09-24 MED ORDER — OXYCODONE HCL 5 MG PO TABS
5.0000 mg | ORAL_TABLET | ORAL | Status: DC | PRN
  Administered 2024-09-24: 10 mg via ORAL
  Administered 2024-09-24 (×2): 5 mg via ORAL
  Administered 2024-09-25 (×2): 10 mg via ORAL
  Filled 2024-09-24 (×2): qty 1
  Filled 2024-09-24 (×3): qty 2

## 2024-09-24 MED ORDER — FENTANYL CITRATE (PF) 50 MCG/ML IJ SOSY
50.0000 ug | PREFILLED_SYRINGE | INTRAMUSCULAR | Status: DC
  Administered 2024-09-24: 50 ug via INTRAVENOUS
  Filled 2024-09-24: qty 2

## 2024-09-24 MED ORDER — CEFAZOLIN SODIUM-DEXTROSE 2-4 GM/100ML-% IV SOLN
2.0000 g | Freq: Four times a day (QID) | INTRAVENOUS | Status: AC
  Administered 2024-09-24 (×2): 2 g via INTRAVENOUS
  Filled 2024-09-24 (×2): qty 100

## 2024-09-24 MED ORDER — SODIUM CHLORIDE (PF) 0.9 % IJ SOLN
INTRAMUSCULAR | Status: DC | PRN
  Administered 2024-09-24: 80 mL

## 2024-09-24 MED ORDER — BUPIVACAINE IN DEXTROSE 0.75-8.25 % IT SOLN
INTRATHECAL | Status: DC | PRN
  Administered 2024-09-24: 1.8 mL via INTRATHECAL

## 2024-09-24 MED ORDER — ACETAMINOPHEN 325 MG PO TABS: 325.0000 mg | ORAL_TABLET | Freq: Four times a day (QID) | ORAL | Status: DC | PRN

## 2024-09-24 MED ORDER — PHENYLEPHRINE HCL-NACL 20-0.9 MG/250ML-% IV SOLN
INTRAVENOUS | Status: DC | PRN
  Administered 2024-09-24: 40 ug/min via INTRAVENOUS

## 2024-09-24 MED ORDER — BUPIVACAINE LIPOSOME 1.3 % IJ SUSP
INTRAMUSCULAR | Status: AC
  Filled 2024-09-24: qty 20

## 2024-09-24 MED ORDER — TRANEXAMIC ACID-NACL 1000-0.7 MG/100ML-% IV SOLN
1000.0000 mg | Freq: Once | INTRAVENOUS | Status: AC
  Administered 2024-09-24: 1000 mg via INTRAVENOUS
  Filled 2024-09-24: qty 100

## 2024-09-24 MED ORDER — ONDANSETRON HCL 4 MG/2ML IJ SOLN
INTRAMUSCULAR | Status: DC | PRN
  Administered 2024-09-24: 4 mg via INTRAVENOUS

## 2024-09-24 MED ORDER — ARFORMOTEROL TARTRATE 15 MCG/2ML IN NEBU
15.0000 ug | INHALATION_SOLUTION | Freq: Two times a day (BID) | RESPIRATORY_TRACT | Status: DC
  Administered 2024-09-24: 15 ug via RESPIRATORY_TRACT
  Filled 2024-09-24 (×2): qty 2

## 2024-09-24 MED ORDER — UMECLIDINIUM BROMIDE 62.5 MCG/ACT IN AEPB
1.0000 | INHALATION_SPRAY | Freq: Every day | RESPIRATORY_TRACT | Status: DC
  Filled 2024-09-24: qty 7

## 2024-09-24 MED ORDER — ATENOLOL 25 MG PO TABS
12.5000 mg | ORAL_TABLET | Freq: Every day | ORAL | Status: DC
  Administered 2024-09-24: 12.5 mg via ORAL
  Filled 2024-09-24: qty 1

## 2024-09-24 MED ORDER — BUPIVACAINE-EPINEPHRINE (PF) 0.5% -1:200000 IJ SOLN
INTRAMUSCULAR | Status: DC | PRN
  Administered 2024-09-24: 20 mL via PERINEURAL

## 2024-09-24 MED ORDER — BUPIVACAINE LIPOSOME 1.3 % IJ SUSP: 20.0000 mL | Freq: Once | INTRAMUSCULAR | Status: DC

## 2024-09-24 MED ORDER — MIDAZOLAM HCL (PF) 2 MG/2ML IJ SOLN: 1.0000 mg | INTRAMUSCULAR | Status: DC

## 2024-09-24 MED ORDER — ONDANSETRON HCL 4 MG/2ML IJ SOLN
INTRAMUSCULAR | Status: AC
  Filled 2024-09-24: qty 2

## 2024-09-24 MED ORDER — LACTATED RINGERS IV SOLN: INTRAVENOUS | Status: DC

## 2024-09-24 MED ORDER — SODIUM CHLORIDE 0.9 % IR SOLN
Status: DC | PRN
  Administered 2024-09-24: 1000 mL

## 2024-09-24 MED ORDER — METHOCARBAMOL 500 MG PO TABS
500.0000 mg | ORAL_TABLET | Freq: Four times a day (QID) | ORAL | Status: DC | PRN
  Administered 2024-09-24 – 2024-09-25 (×3): 500 mg via ORAL
  Filled 2024-09-24 (×3): qty 1

## 2024-09-24 MED ORDER — GLYCOPYRROLATE 0.2 MG/ML IJ SOLN
INTRAMUSCULAR | Status: DC | PRN
  Administered 2024-09-24 (×2): .1 mg via INTRAVENOUS

## 2024-09-24 MED ORDER — ORAL CARE MOUTH RINSE: 15.0000 mL | Freq: Once | OROMUCOSAL | Status: AC

## 2024-09-24 MED ORDER — WATER FOR IRRIGATION, STERILE IR SOLN
Status: DC | PRN
  Administered 2024-09-24: 2000 mL

## 2024-09-24 MED ORDER — METHOCARBAMOL 1000 MG/10ML IJ SOLN: 500.0000 mg | Freq: Four times a day (QID) | INTRAMUSCULAR | Status: DC | PRN

## 2024-09-24 MED ORDER — ONDANSETRON HCL 4 MG PO TABS: 4.0000 mg | ORAL_TABLET | Freq: Three times a day (TID) | ORAL | 1 refills | Status: AC | PRN

## 2024-09-24 MED ORDER — CEFAZOLIN SODIUM-DEXTROSE 2-4 GM/100ML-% IV SOLN
2.0000 g | INTRAVENOUS | Status: AC
  Administered 2024-09-24: 2 g via INTRAVENOUS
  Filled 2024-09-24: qty 100

## 2024-09-24 MED ORDER — SODIUM CHLORIDE 0.9 % IV SOLN: INTRAVENOUS | Status: DC

## 2024-09-24 MED ORDER — POLYETHYLENE GLYCOL 3350 17 G PO PACK: 17.0000 g | PACK | Freq: Every day | ORAL | Status: DC | PRN

## 2024-09-24 MED ORDER — BUPIVACAINE-EPINEPHRINE (PF) 0.25% -1:200000 IJ SOLN
INTRAMUSCULAR | Status: AC
  Filled 2024-09-24: qty 30

## 2024-09-24 MED ORDER — POVIDONE-IODINE 10 % EX SWAB: 2.0000 | Freq: Once | CUTANEOUS | Status: DC

## 2024-09-24 MED ORDER — ONDANSETRON HCL 4 MG/2ML IJ SOLN: 4.0000 mg | Freq: Four times a day (QID) | INTRAMUSCULAR | Status: DC | PRN

## 2024-09-24 MED ORDER — DEXAMETHASONE SOD PHOSPHATE PF 10 MG/ML IJ SOLN
INTRAMUSCULAR | Status: DC | PRN
  Administered 2024-09-24: 5 mg via INTRAVENOUS

## 2024-09-24 MED ORDER — GLYCOPYRROLATE 0.2 MG/ML IJ SOLN
INTRAMUSCULAR | Status: AC
  Filled 2024-09-24: qty 1

## 2024-09-24 MED ORDER — SODIUM CHLORIDE (PF) 0.9 % IJ SOLN
INTRAMUSCULAR | Status: AC
  Filled 2024-09-24: qty 30

## 2024-09-24 SURGICAL SUPPLY — 44 items
ATTUNE MED DOME PAT 41 KNEE (Knees) IMPLANT
ATTUNE PS FEM RT SZ9 CEM KNEE (Femur) IMPLANT
BAG COUNTER SPONGE SURGICOUNT (BAG) IMPLANT
BAG ZIPLOCK 12X15 (MISCELLANEOUS) ×1 IMPLANT
BASE TIBIAL ROT PLAT SZ 8 KNEE (Knees) IMPLANT
BLADE SAG 18X100X1.27 (BLADE) ×1 IMPLANT
BLADE SAW SGTL 13X75X1.27 (BLADE) ×1 IMPLANT
BNDG ELASTIC 6X10 VLCR STRL LF (GAUZE/BANDAGES/DRESSINGS) ×1 IMPLANT
BNDG GAUZE DERMACEA FLUFF 4 (GAUZE/BANDAGES/DRESSINGS) ×1 IMPLANT
BOWL SMART MIX CTS (DISPOSABLE) ×1 IMPLANT
CEMENT HV SMART SET (Cement) ×2 IMPLANT
COVER SURGICAL LIGHT HANDLE (MISCELLANEOUS) ×1 IMPLANT
CUFF TRNQT CYL 34X4.125X (TOURNIQUET CUFF) ×1 IMPLANT
DRAPE SHEET LG 3/4 BI-LAMINATE (DRAPES) ×1 IMPLANT
DRAPE U-SHAPE 47X51 STRL (DRAPES) ×1 IMPLANT
DRSG ADAPTIC 3X8 NADH LF (GAUZE/BANDAGES/DRESSINGS) ×1 IMPLANT
DURAPREP 26ML APPLICATOR (WOUND CARE) ×1 IMPLANT
ELECT PENCIL ROCKER SW 15FT (MISCELLANEOUS) ×1 IMPLANT
ELECT REM PT RETURN 15FT ADLT (MISCELLANEOUS) ×1 IMPLANT
GAUZE PAD ABD 8X10 STRL (GAUZE/BANDAGES/DRESSINGS) ×1 IMPLANT
GAUZE SPONGE 4X4 12PLY STRL (GAUZE/BANDAGES/DRESSINGS) ×1 IMPLANT
GLOVE BIOGEL PI IND STRL 7.5 (GLOVE) ×1 IMPLANT
GLOVE BIOGEL PI IND STRL 8.5 (GLOVE) ×1 IMPLANT
GLOVE ORTHO TXT STRL SZ7.5 (GLOVE) ×1 IMPLANT
GLOVE SURG ORTHO 8.5 STRL (GLOVE) ×1 IMPLANT
GOWN STRL REUS W/ TWL XL LVL3 (GOWN DISPOSABLE) ×2 IMPLANT
IMMOBILIZER KNEE 20 THIGH 36 (SOFTGOODS) ×1 IMPLANT
INSERT TIBIAL ATTUNE SZ9 5MM (Insert) IMPLANT
KIT TURNOVER KIT A (KITS) ×1 IMPLANT
MANIFOLD NEPTUNE II (INSTRUMENTS) ×1 IMPLANT
NS IRRIG 1000ML POUR BTL (IV SOLUTION) ×1 IMPLANT
PACK TOTAL KNEE CUSTOM (KITS) ×1 IMPLANT
PIN STEINMAN FIXATION KNEE (PIN) IMPLANT
PROTECTOR NERVE ULNAR (MISCELLANEOUS) ×1 IMPLANT
SET HNDPC FAN SPRY TIP SCT (DISPOSABLE) ×1 IMPLANT
STRIP CLOSURE SKIN 1/2X4 (GAUZE/BANDAGES/DRESSINGS) ×2 IMPLANT
SUT MNCRL AB 3-0 PS2 18 (SUTURE) ×1 IMPLANT
SUT VIC AB 0 CT1 36 (SUTURE) ×1 IMPLANT
SUT VIC AB 1 CT1 36 (SUTURE) ×2 IMPLANT
SUT VIC AB 2-0 CT1 TAPERPNT 27 (SUTURE) ×1 IMPLANT
TOWEL GREEN STERILE FF (TOWEL DISPOSABLE) ×1 IMPLANT
TRAY CATH INTERMITTENT SS 16FR (CATHETERS) ×1 IMPLANT
WATER STERILE IRR 1000ML POUR (IV SOLUTION) ×2 IMPLANT
YANKAUER SUCT BULB TIP NO VENT (SUCTIONS) ×1 IMPLANT

## 2024-09-24 NOTE — Transfer of Care (Signed)
 Immediate Anesthesia Transfer of Care Note  Patient: Christian Rojas  Procedure(s) Performed: Procedure(s): ARTHROPLASTY, KNEE, TOTAL (Right)  Patient Location: PACU  Anesthesia Type:MAC, Regional, and Spinal  Level of Consciousness: Patient easily awoken, comfortable, cooperative, following commands, responds to stimulation.   Airway & Oxygen Therapy: Patient spontaneously breathing, ventilating well, oxygen via simple oxygen mask.  Post-op Assessment: Report given to PACU RN, vital signs reviewed and stable, moving all extremities.   Post vital signs: Reviewed and stable.  Complications: No apparent anesthesia complications  Last Vitals:  Vitals Value Taken Time  BP 100/64 09/24/24 11:57  Temp    Pulse 75 09/24/24 11:59  Resp 11 09/24/24 11:59  SpO2 100 % 09/24/24 11:59  Vitals shown include unfiled device data.  Last Pain:  Vitals:   09/24/24 0752  TempSrc:   PainSc: 4          Complications: No notable events documented.

## 2024-09-24 NOTE — Progress Notes (Signed)
   09/24/24 2246  BiPAP/CPAP/SIPAP  $ Non-Invasive Home Ventilator  Initial  BiPAP/CPAP/SIPAP Pt Type Adult  BiPAP/CPAP/SIPAP Resmed  Dentures removed? Not applicable  Mask Size Large  Respiratory Rate 20 breaths/min  EPAP 13 cmH2O  FiO2 (%) 21 %  Patient Home Machine No  Patient Home Mask Yes  Patient Home Tubing Yes  Auto Titrate No  Nasal massage performed Yes  CPAP/SIPAP surface wiped down Yes  Device Plugged into RED Power Outlet Yes  BiPAP/CPAP /SiPAP Vitals  Pulse Rate 71  Resp 20  SpO2 94 %  Bilateral Breath Sounds Clear;Diminished

## 2024-09-24 NOTE — Anesthesia Postprocedure Evaluation (Signed)
 Anesthesia Post Note  Patient: Christian Rojas  Procedure(s) Performed: ARTHROPLASTY, KNEE, TOTAL (Right: Knee)     Patient location during evaluation: PACU Anesthesia Type: Spinal and Regional Level of consciousness: oriented and awake and alert Pain management: pain level controlled Vital Signs Assessment: post-procedure vital signs reviewed and stable Respiratory status: spontaneous breathing, respiratory function stable and patient connected to nasal cannula oxygen Cardiovascular status: blood pressure returned to baseline and stable Postop Assessment: no headache, no backache, no apparent nausea or vomiting, spinal receding and patient able to bend at knees Anesthetic complications: no   No notable events documented.  Last Vitals:  Vitals:   09/24/24 1200 09/24/24 1300  BP: 105/61 130/68  Pulse: (!) 57 77  Resp: 12 17  Temp: 37.1 C   SpO2: 100% 100%    Last Pain:  Vitals:   09/24/24 1300  TempSrc:   PainSc: 4                  Phelan Goers,W. EDMOND

## 2024-09-24 NOTE — Plan of Care (Signed)

## 2024-09-24 NOTE — Evaluation (Signed)
 Physical Therapy Evaluation Patient Details Name: Christian Rojas MRN: [id number] DOB: 23-Jul-1946 Today's Date: 09/24/2024  History of Present Illness  78 yo male presents to therapy s/p R TKA on 09/24/2024 due to failure of conservative measures. Pt PMH includes but is not limited to: A-fib, COPD, DOE, HTN, LBP, OSA, R elbow surgery and hernia repair.  Clinical Impression    Christian Rojas is a 78 y.o. male POD 0 s/p R TKA. Patient reports mod I with mobility at baseline. Patient is now limited by functional impairments (see PT problem list below) and requires CGA for bed mobility and CGA for transfers. Patient was able to ambulate 50 feet with RW and CGA level of assist. Patient instructed in exercise to facilitate ROM and circulation to manage edema. Patient will benefit from continued skilled PT interventions to address impairments and progress towards PLOF. Acute PT will follow to progress mobility and stair training in preparation for safe discharge home with family support and OPPT services.       If plan is discharge home, recommend the following: A little help with walking and/or transfers;A little help with bathing/dressing/bathroom;Assistance with cooking/housework;Assist for transportation;Help with stairs or ramp for entrance   Can travel by private vehicle        Equipment Recommendations Rolling walker (2 wheels) (delivered 12/5)  Recommendations for Other Services       Functional Status Assessment Patient has had a recent decline in their functional status and demonstrates the ability to make significant improvements in function in a reasonable and predictable amount of time.     Precautions / Restrictions Precautions Precautions: Fall;Knee Restrictions Weight Bearing Restrictions Per Provider Order: No      Mobility  Bed Mobility Overal bed mobility: Needs Assistance Bed Mobility: Supine to Sit     Supine to sit: Supervision, HOB elevated, Used rails      General bed mobility comments: min cues    Transfers Overall transfer level: Needs assistance Equipment used: Rolling walker (2 wheels) Transfers: Sit to/from Stand Sit to Stand: Contact guard assist           General transfer comment: min cues    Ambulation/Gait Ambulation/Gait assistance: Contact guard assist Gait Distance (Feet): 50 Feet Assistive device: Rolling walker (2 wheels) Gait Pattern/deviations: Step-to pattern, Decreased stance time - right, Antalgic, Trunk flexed Gait velocity: decreased     General Gait Details: slight trunk flexion with B UE support at Rw to offload R LE in stance phase, min cues for posture, proper distance from RW and RW management  Stairs            Wheelchair Mobility     Tilt Bed    Modified Rankin (Stroke Patients Only)       Balance Overall balance assessment: Needs assistance, History of Falls Sitting-balance support: Feet supported Sitting balance-Leahy Scale: Good     Standing balance support: Bilateral upper extremity supported, During functional activity, Reliant on assistive device for balance Standing balance-Leahy Scale: Fair Standing balance comment: static standing no UE support with wide BOS and no overt LOB                             Pertinent Vitals/Pain Pain Assessment Pain Assessment: 0-10 Pain Score: 5  Pain Location: R LE and knee (lateral) Pain Descriptors / Indicators: Aching, Constant, Discomfort, Dull, Grimacing, Operative site guarding Pain Intervention(s): Limited activity within patient's tolerance, Monitored during session, Premedicated before session, Repositioned,  Ice applied    Home Living Family/patient expects to be discharged to:: Private residence Living Arrangements: Spouse/significant other;Children Available Help at Discharge: Family Type of Home: House Home Access: Stairs to enter Entrance Stairs-Rails: None Entrance Stairs-Number of Steps: 1   Home  Layout: One level Home Equipment: [employer] (2 wheels);[place] - single point (delivered to room 12/5, iceman machine) Additional Comments: lives a portion of the year in [facility]    Prior Function Prior Level of Function : Independent/Modified Independent             Mobility Comments: mod I with use of SPC for all ADLs, self care tasks and IADLs       Extremity/Trunk Assessment        Lower Extremity Assessment Lower Extremity Assessment: RLE deficits/detail RLE Deficits / Details: ankle DF/PF 5/5; SLR < 10 degree lag RLE Sensation: WNL;history of peripheral neuropathy    Cervical / Trunk Assessment Cervical / Trunk Assessment: Normal  Communication   Communication Communication: No apparent difficulties    Cognition Arousal: Alert Behavior During Therapy: WFL for tasks assessed/performed   PT - Cognitive impairments: No apparent impairments                         Following commands: Intact       Cueing       General Comments      Exercises Total Joint Exercises Ankle Circles/Pumps: AROM, Both, 10 reps   Assessment/Plan    PT Assessment Patient needs continued PT services  PT Problem List Decreased range of motion;Decreased strength;Decreased activity tolerance;Decreased mobility;Decreased balance;Decreased coordination;Pain       PT Treatment Interventions DME instruction;Gait training;Stair training;Functional mobility training;Therapeutic activities;Balance training;Neuromuscular re-education;Therapeutic exercise;Patient/family education;Modalities    PT Goals (Current goals can be found in the Care Plan section)  Acute Rehab PT Goals Patient Stated Goal: to be able to be more active, walk, bike and play golf PT Goal Formulation: With patient/family Time For Goal Achievement: 10/08/24 Potential to Achieve Goals: Good    Frequency 7X/week     Co-evaluation               AM-PAC PT 6 Clicks Mobility  Outcome Measure Help  needed turning from your back to your side while in a flat bed without using bedrails?: None Help needed moving from lying on your back to sitting on the side of a flat bed without using bedrails?: A Little Help needed moving to and from a bed to a chair (including a wheelchair)?: A Little Help needed standing up from a chair using your arms (e.g., wheelchair or bedside chair)?: A Little Help needed to walk in hospital room?: A Little Help needed climbing 3-5 steps with a railing? : A Lot 6 Click Score: 18    End of Session Equipment Utilized During Treatment: Gait belt Activity Tolerance: Patient tolerated treatment well Patient left: in chair;with call bell/phone within reach;with family/visitor present Nurse Communication: Mobility status PT Visit Diagnosis: Unsteadiness on feet (R26.81);Other abnormalities of gait and mobility (R26.89);Muscle weakness (generalized) (M62.81);Difficulty in walking, not elsewhere classified (R26.2);Pain Pain - Right/Left: Right Pain - part of body: Knee;Leg    Time: 1701-1747 PT Time Calculation (min) (ACUTE ONLY): 46 min   Charges:   PT Evaluation $PT Eval Low Complexity: 1 Low PT Treatments $Gait Training: 8-22 mins $Therapeutic Activity: 8-22 mins PT General Charges $$ ACUTE PT VISIT: 1 Visit         [place], PT  Acute Rehab   [place] VEAR Drone 09/24/2024, 6:03 PM

## 2024-09-24 NOTE — TOC Transition Note (Signed)
 Transition of Care [facility] - Discharge Note   Patient Details  Name: Christian Rojas MRN: [id number] Date of Birth: November 28, 1945  Transition of Care [facility] CM/SW Contact:  NORMAN ASPEN, LCSW Phone Number: 09/24/2024, 3:02 PM   Clinical Narrative:     Met with pt and confirming need for RW and no DME agency preference.  Order placed with [employer] and item delivered to room.  Pt note OPPT already arranged with [facility].  No further IP CM needs.  Final next level of care: OP Rehab Barriers to Discharge: No Barriers Identified   Patient Goals and CMS Choice Patient states their goals for this hospitalization and ongoing recovery are:: return home          Discharge Placement                       Discharge Plan and Services Additional resources added to the After Visit Summary for                  DME Arranged: Walker rolling DME Agency: [employer] Date DME Agency Contacted: 09/24/24                Social Drivers of Health (SDOH) Interventions SDOH Screenings   Food Insecurity: No Food Insecurity (09/24/2024)  Housing: Low Risk  (09/24/2024)  Transportation Needs: No Transportation Needs (09/24/2024)  Utilities: Not At Risk (09/24/2024)  Social Connections: Socially Integrated (09/24/2024)  Tobacco Use: Medium Risk (09/24/2024)     Readmission Risk Interventions     No data to display

## 2024-09-24 NOTE — Anesthesia Procedure Notes (Signed)
 Spinal  Patient location during procedure: OR Start time: 09/24/2024 10:06 AM End time: 09/24/2024 10:11 AM Reason for block: surgical anesthesia Staffing Performed: anesthesiologist  Anesthesiologist: Epifanio Fallow, MD Performed by: Epifanio Fallow, MD Authorized by: Epifanio Fallow, MD   Preanesthetic Checklist Completed: patient identified, IV checked, risks and benefits discussed, surgical consent, monitors and equipment checked, pre-op evaluation and timeout performed Spinal Block Patient position: sitting Prep: DuraPrep Patient monitoring: cardiac monitor, continuous pulse ox and blood pressure Approach: midline Location: L3-4 Injection technique: single-shot Needle Needle type: Pencan  Needle gauge: 24 G Needle length: 9 cm Assessment Sensory level: T8 Events: CSF return Additional Notes Functioning IV was confirmed and monitors were applied. Sterile prep and drape, including hand hygiene and sterile gloves were used. The patient was positioned and the spine was prepped. The skin was anesthetized with lidocaine.  Free flow of clear CSF was obtained prior to injecting local anesthetic into the CSF.  The spinal needle aspirated freely following injection.  The needle was carefully withdrawn.  The patient tolerated the procedure well.

## 2024-09-24 NOTE — Discharge Instructions (Signed)
 Ice to the knee constantly.  Keep the incision covered and clean and dry for one week, then ok to get it wet in the shower. You may shower with the gel bandage on.   Do exercise as instructed every hour, please to prevent stiffness.    DO NOT prop anything under the knee, it will make your knee stiff.  Prop under the ankle to encourage your knee to go straight.   Use the walker while you are up and around for balance.  Wear your support stockings 24/7 to prevent blood clots for 30 days and take your blood thinners as you normally do to prevent blood clots  Follow up with Dr Kay in two weeks in the office, call [phone] for appt  Please call Dr Kay (cell) [phone] with any questions or concerns    INSTRUCTIONS AFTER JOINT REPLACEMENT   Remove items at home which could result in a fall. This includes throw rugs or furniture in walking pathways ICE to the affected joint every three hours while awake for 30 minutes at a time, for at least the first 3-5 days, and then as needed for pain and swelling.  Continue to use ice for pain and swelling. You may notice swelling that will progress down to the foot and ankle.  This is normal after surgery.  Elevate your leg when you are not up walking on it.   Continue to use the breathing machine you got in the hospital (incentive spirometer) which will help keep your temperature down.  It is common for your temperature to cycle up and down following surgery, especially at night when you are not up moving around and exerting yourself.  The breathing machine keeps your lungs expanded and your temperature down.   DIET:  As you were doing prior to hospitalization, we recommend a well-balanced diet.  DRESSING / WOUND CARE / SHOWERING  Keep the surgical dressing until follow up.  The dressing is water  proof, so you can shower without any extra covering.  IF THE DRESSING FALLS OFF or the wound gets wet inside, change the dressing with sterile gauze.   Please use good hand washing techniques before changing the dressing.  Do not use any lotions or creams on the incision until instructed by your surgeon.    ACTIVITY  Increase activity slowly as tolerated, but follow the weight bearing instructions below.   No driving for 6 weeks or until further direction given by your physician.  You cannot drive while taking narcotics.  No lifting or carrying greater than 10 lbs. until further directed by your surgeon. Avoid periods of inactivity such as sitting longer than an hour when not asleep. This helps prevent blood clots.  You may return to work once you are authorized by your doctor.     WEIGHT BEARING   Weight bearing as tolerated with assist device (walker, cane, etc) as directed, use it as long as suggested by your surgeon or therapist, typically at least 4-6 weeks.   EXERCISES  Results after joint replacement surgery are often greatly improved when you follow the exercise, range of motion and muscle strengthening exercises prescribed by your doctor. Safety measures are also important to protect the joint from further injury. Any time any of these exercises cause you to have increased pain or swelling, decrease what you are doing until you are comfortable again and then slowly increase them. If you have problems or questions, call your caregiver or physical therapist for advice.  Rehabilitation is important following a joint replacement. After just a few days of immobilization, the muscles of the leg can become weakened and shrink (atrophy).  These exercises are designed to build up the tone and strength of the thigh and leg muscles and to improve motion. Often times heat used for twenty to thirty minutes before working out will loosen up your tissues and help with improving the range of motion but do not use heat for the first two weeks following surgery (sometimes heat can increase post-operative swelling).   These exercises can be done on a  training (exercise) mat, on the floor, on a table or on a bed. Use whatever works the best and is most comfortable for you.    Use music or television while you are exercising so that the exercises are a pleasant break in your day. This will make your life better with the exercises acting as a break in your routine that you can look forward to.   Perform all exercises about fifteen times, three times per day or as directed.  You should exercise both the operative leg and the other leg as well.  Exercises include:   Quad Sets - Tighten up the muscle on the front of the thigh (Quad) and hold for 5-10 seconds.   Straight Leg Raises - With your knee straight (if you were given a brace, keep it on), lift the leg to 60 degrees, hold for 3 seconds, and slowly lower the leg.  Perform this exercise against resistance later as your leg gets stronger.  Leg Slides: Lying on your back, slowly slide your foot toward your buttocks, bending your knee up off the floor (only go as far as is comfortable). Then slowly slide your foot back down until your leg is flat on the floor again.  Angel Wings: Lying on your back spread your legs to the side as far apart as you can without causing discomfort.  Hamstring Strength:  Lying on your back, push your heel against the floor with your leg straight by tightening up the muscles of your buttocks.  Repeat, but this time bend your knee to a comfortable angle, and push your heel against the floor.  You may put a pillow under the heel to make it more comfortable if necessary.   A rehabilitation program following joint replacement surgery can speed recovery and prevent re-injury in the future due to weakened muscles. Contact your doctor or a physical therapist for more information on knee rehabilitation.    CONSTIPATION  Constipation is defined medically as fewer than three stools per week and severe constipation as less than one stool per week.  Even if you have a regular bowel  pattern at home, your normal regimen is likely to be disrupted due to multiple reasons following surgery.  Combination of anesthesia, postoperative narcotics, change in appetite and fluid intake all can affect your bowels.   YOU MUST use at least one of the following options; they are listed in order of increasing strength to get the job done.  They are all available over the counter, and you may need to use some, POSSIBLY even all of these options:    Drink plenty of fluids (prune juice may be helpful) and high fiber foods Colace 100 mg by mouth twice a day  Senokot for constipation as directed and as needed Dulcolax (bisacodyl), take with full glass of water   Miralax  (polyethylene glycol) once or twice a day as needed.  If you have  tried all these things and are unable to have a bowel movement in the first 3-4 days after surgery call either your surgeon or your primary doctor.    If you experience loose stools or diarrhea, hold the medications until you stool forms back up.  If your symptoms do not get better within 1 week or if they get worse, check with your doctor.  If you experience the worst abdominal pain ever or develop nausea or vomiting, please contact the office immediately for further recommendations for treatment.   ITCHING:  If you experience itching with your medications, try taking only a single pain pill, or even half a pain pill at a time.  You can also use Benadryl over the counter for itching or also to help with sleep.   TED HOSE STOCKINGS:  Use stockings on both legs until for at least 2 weeks or as directed by physician office. They may be removed at night for sleeping.  MEDICATIONS:  See your medication summary on the "After Visit Summary" that nursing will review with you.  You may have some home medications which will be placed on hold until you complete the course of blood thinner medication.  It is important for you to complete the blood thinner medication as  prescribed.  PRECAUTIONS:  If you experience chest pain or shortness of breath - call 911 immediately for transfer to the hospital emergency department.   If you develop a fever greater that 101 F, purulent drainage from wound, increased redness or drainage from wound, foul odor from the wound/dressing, or calf pain - CONTACT YOUR SURGEON.                                                   FOLLOW-UP APPOINTMENTS:  If you do not already have a post-op appointment, please call the office for an appointment to be seen by your surgeon.  Guidelines for how soon to be seen are listed in your "After Visit Summary", but are typically between 1-4 weeks after surgery.  OTHER INSTRUCTIONS:   Knee Replacement:  Do not place pillow under knee, focus on keeping the knee straight while resting. CPM instructions: 0-90 degrees, 2 hours in the morning, 2 hours in the afternoon, and 2 hours in the evening. Place foam block, curve side up under heel at all times except when in CPM or when walking.  DO NOT modify, tear, cut, or change the foam block in any way.  POST-OPERATIVE OPIOID TAPER INSTRUCTIONS: It is important to wean off of your opioid medication as soon as possible. If you do not need pain medication after your surgery it is ok to stop day one. Opioids include: Codeine, Hydrocodone(Norco, Vicodin), Oxycodone (Percocet, oxycontin ) and hydromorphone  amongst others.  Long term and even short term use of opiods can cause: Increased pain response Dependence Constipation Depression Respiratory depression And more.  Withdrawal symptoms can include Flu like symptoms Nausea, vomiting And more Techniques to manage these symptoms Hydrate well Eat regular healthy meals Stay active Use relaxation techniques(deep breathing, meditating, yoga) Do Not substitute Alcohol to help with tapering If you have been on opioids for less than two weeks and do not have pain than it is ok to stop all together.  Plan to  wean off of opioids This plan should start within one week post op of your joint  replacement. Maintain the same interval or time between taking each dose and first decrease the dose.  Cut the total daily intake of opioids by one tablet each day Next start to increase the time between doses. The last dose that should be eliminated is the evening dose.   MAKE SURE YOU:  Understand these instructions.  Get help right away if you are not doing well or get worse.    Thank you for letting us  be a part of your medical care team.  It is a privilege we respect greatly.  We hope these instructions will help you stay on track for a fast and full recovery!

## 2024-09-24 NOTE — Anesthesia Procedure Notes (Signed)
 Procedure Name: MAC Date/Time: 09/24/2024 10:03 AM  Performed by: Joshua Vernell BROCKS, CRNAPre-anesthesia Checklist: Patient identified, Emergency Drugs available, Suction available and Patient being monitored Patient Re-evaluated:Patient Re-evaluated prior to induction Oxygen Delivery Method: Simple face mask Preoxygenation: Pre-oxygenation with 100% oxygen Placement Confirmation: positive ETCO2 and breath sounds checked- equal and bilateral Dental Injury: Teeth and Oropharynx as per pre-operative assessment

## 2024-09-24 NOTE — Progress Notes (Signed)
 Orthopedic Tech Progress Note Patient Details:  Christian Rojas 01-23-1946 [id number] CPM will be removed at 1700 CPM Right Knee CPM Right Knee: On Right Knee Flexion (Degrees): 90 Right Knee Extension (Degrees): 0  Post Interventions Patient Tolerated: Well Ortho Devices Type of Ortho Device: Bone foam zero knee Ortho Device/Splint Location: Right knee Ortho Device/Splint Interventions: Application   Post Interventions Patient Tolerated: Well  Massie BRAVO [name] 09/24/2024, 1:06 PM

## 2024-09-24 NOTE — Op Note (Signed)
 NAMEAUBERY, Christian Rojas MEDICAL RECORD NO: [id number] ACCOUNT NO: [id number] DATE OF BIRTH: 1946/07/21 FACILITY: [facility] LOCATION: [facility] PHYSICIAN: Elspeth SAUNDERS. Kay, MD  Operative Report   DATE OF PROCEDURE: 09/24/2024  PREOPERATIVE DIAGNOSIS:  Right knee end-stage osteoarthritis.  POSTOPERATIVE DIAGNOSIS:  Right knee end-stage osteoarthritis.  PROCEDURE PERFORMED:  Right total knee arthroplasty using DePuy Attune prosthesis.  ATTENDING SURGEON:  Elspeth SAUNDERS. Kay, MD  ASSISTANT:  Debby Crock [place], [place], who was scrubbed during the entire procedure, and necessary for satisfactory completion of surgery.  ANESTHESIA:  Spinal anesthesia was used plus adductor canal block.  ESTIMATED BLOOD LOSS:  Minimal.  FLUID REPLACEMENT:  1000 mL crystalloid.  COUNTS:  Instrument counts correct.  COMPLICATIONS:  No complications.  ANTIBIOTICS:  Perioperative antibiotics were given.  TOURNIQUET TIME:  82 minutes at 275 mmHg.  INDICATIONS:  The patient is a 78 year old male who presents with a history of worsening right knee pain secondary to end-stage arthritis.  The patient has a flexion contracture and has failed conservative management and desires operative treatment.   Informed consent was obtained.  DESCRIPTION OF PROCEDURE:  After an adequate level of anesthesia was achieved, the patient was positioned supine on the operating room table.  A nonsterile tourniquet was placed on the right proximal thigh.  The right leg was sterilely prepped and draped  in the usual manner.  A timeout was called verifying correct patient and correct site.  We elevated the leg and exsanguinated with an Esmarch bandage, inflating the tourniquet to 275 mmHg.  We then placed the knee in flexion and performed a longitudinal  midline incision with a 10-blade scalpel.  We used a fresh 10-blade scalpel for the medial parapatellar arthrotomy.  We then divided the lateral patellofemoral ligaments, everting the patella and  exposing the distal femur, which was devoid of cartilage.   We entered the distal femur with a step-cut drill.  We then placed our intramedullary guide and resected 11 mm off the distal femur set on 5 degrees of valgus as the patient did have a flexion contracture.  After cutting the distal femur, we sized the  femur to a size 9 anterior down, performing anterior, posterior, and chamfer cuts with the 4-in-1 block.  Next, we removed ACL, PCL, and meniscal tissue, subluxating the tibia anteriorly.  We then cut the tibia using an external jig 90 degrees  perpendicular to the long axis of the tibia with minimal posterior slope for this posterior cruciate substituting prosthesis.  Once we had our tibial cut done, we used a laminar spreader, removed posterior femoral condyle osteophytes, and released the  posterior capsule.  We then injected the posterior capsule with a combination of Marcaine , Exparel , and saline.  Next, we checked our gaps, which were symmetric at 5 mm.  We then completed our tibia preparation for the size 8 tibia with the modular drill  and keel punch.  We placed the midpoint of the tibial component at the medial one-third of the tibial tubercle.  Once we had that preparation complete, we went to the femur and we cut the box for the 9 right knee.  We drilled our lug holes once we had  our femoral trial in place.  We placed the knee in extension with a size 9, 5 mm poly, and we were able to get full extension and had good flexion stability.  We then resurfaced the patella, going from a 25 mm thickness down to a 15 mm thickness,  drilling lug holes for  the 41 patellar button.  We ranged the knee and had lateral patellar maltracking, so we went ahead and did a lateral release, which improved tracking tremendously, and we had excellent tracking with no-touch technique after the  lateral release.  We removed all trial components.  We irrigated thoroughly.  We did pulsatile lavage.  We drilled with  the pin on power on the sclerotic medial bone.  After drying the bone really well and vacuum mixing high-viscosity cement, we cemented  the components in place, so the size 8 tibia, the 9 right femur, and we used a size 9, 5 mm poly and placed it on the tibia and reduced the knee.  Then, we used a patellar clamp to clamp the 41 patellar in place.  Once all cement was hardened on the  back table, we removed excess cement with a quarter-inch curved osteotome.  We inspected the entirety of the knee, including the posterior aspect of the knee.  We then selected the real size 9, 5 mm poly, placed it on the tibial tray, and reduced the  knee, nice pop as the medial side reduced.  We were able to get to full extension and normal patellar tracking.  We then injected the anterior capsule with a combination of Marcaine , Exparel , and saline.  We irrigated thoroughly, and then we went ahead  and closed the parapatellar arthrotomy with #1 Vicryl suture followed by 0 and 2-0 layered subcutaneous closure and 4-0 running Monocryl for skin.  Steri-Strips were applied, followed by a sterile dressing.  The patient tolerated the surgery well.   NIK D: 09/24/2024 12:13:49 pm T: 09/24/2024 12:33:00 pm  JOB: [id number]/ [id number]

## 2024-09-24 NOTE — Interval H&P Note (Signed)
 History and Physical Interval Note:  09/24/2024 9:43 AM  Christian Rojas  has presented today for surgery, with the diagnosis of Primary osteoarthritis of right knee.  The various methods of treatment have been discussed with the patient and family. After consideration of risks, benefits and other options for treatment, the patient has consented to  Procedure(s): ARTHROPLASTY, KNEE, TOTAL (Right) as a surgical intervention.  The patient's history has been reviewed, patient examined, no change in status, stable for surgery.  I have reviewed the patient's chart and labs.  Questions were answered to the patient's satisfaction.     [name]

## 2024-09-24 NOTE — Brief Op Note (Signed)
 09/24/2024  12:08 PM  PATIENT:  Christian Rojas  78 y.o. male  PRE-OPERATIVE DIAGNOSIS:  Primary osteoarthritis of right knee, end stage  POST-OPERATIVE DIAGNOSIS:  Primary osteoarthritis of right knee, end stage  PROCEDURE:  Procedure(s): ARTHROPLASTY, KNEE, TOTAL (Right) DePuy Attune  SURGEON:  Surgeons and Role:    DEWAINE Kay Kemps, MD - Primary  PHYSICIAN ASSISTANT:   ASSISTANTS: Debby KATHEE Fireman, PA-C   ANESTHESIA:   regional and general  EBL:  25 mL   BLOOD ADMINISTERED:none  DRAINS: none   LOCAL MEDICATIONS USED:  MARCAINE      SPECIMEN:  No Specimen  DISPOSITION OF SPECIMEN:  N/A  COUNTS:  YES  TOURNIQUET:   Total Tourniquet Time Documented: Thigh (Right) - 82 minutes Total: Thigh (Right) - 82 minutes   DICTATION: .Other Dictation: Dictation Number [id number]  PLAN OF CARE: Admit for overnight observation  PATIENT DISPOSITION:  PACU - hemodynamically stable.   Delay start of Pharmacological VTE agent (>24hrs) due to surgical blood loss or risk of bleeding: no

## 2024-09-24 NOTE — Anesthesia Procedure Notes (Signed)
 Anesthesia Regional Block: Adductor canal block   Pre-Anesthetic Checklist: , timeout performed,  Correct Patient, Correct Site, Correct Laterality,  Correct Procedure, Correct Position, site marked,  Risks and benefits discussed,  Pre-op evaluation,  At surgeon's request and post-op pain management  Laterality: Right  Prep: Maximum Sterile Barrier Precautions used, chloraprep       Needles:  Injection technique: Single-shot  Needle Type: Echogenic Stimulator Needle     Needle Length: 9cm  Needle Gauge: 21     Additional Needles:   Procedures:,,,, ultrasound used (permanent image in chart),,    Narrative:  Start time: 09/24/2024 8:20 AM End time: 09/24/2024 8:30 AM Injection made incrementally with aspirations every 5 mL.  Performed by: Personally  Anesthesiologist: Epifanio Fallow, MD

## 2024-09-25 NOTE — Progress Notes (Signed)
 Physical Therapy Treatment Patient Details Name: Christian Rojas MRN: [id number] DOB: 1946/01/09 Today's Date: 09/25/2024   History of Present Illness 78 yo male presents to therapy s/p R TKA on 09/24/2024 due to failure of conservative measures. Pt PMH includes but is not limited to: A-fib, COPD, DOE, HTN, LBP, OSA, R elbow surgery and hernia repair.    PT Comments  Pt ambulated 130' with RW, no loss of balance, no buckling. Stair training completed. Pt demonstrates understanding of TKA HEP. He is ready to DC home from a PT standpoint.    If plan is discharge home, recommend the following: A little help with walking and/or transfers;A little help with bathing/dressing/bathroom;Assistance with cooking/housework;Assist for transportation;Help with stairs or ramp for entrance   Can travel by private vehicle        Equipment Recommendations  Rolling walker (2 wheels)    Recommendations for Other Services       Precautions / Restrictions Precautions Precautions: Fall;Knee Precaution Booklet Issued: Yes (comment) Recall of Precautions/Restrictions: Intact Precaution/Restrictions Comments: reviewed no pillow under knee Restrictions Weight Bearing Restrictions Per Provider Order: No     Mobility  Bed Mobility               General bed mobility comments: up in chair    Transfers Overall transfer level: Needs assistance Equipment used: Rolling walker (2 wheels) Transfers: Sit to/from Stand Sit to Stand: Contact guard assist                Ambulation/Gait Ambulation/Gait assistance: Contact guard assist Gait Distance (Feet): 130 Feet Assistive device: Rolling walker (2 wheels) Gait Pattern/deviations: Step-through pattern, Decreased step length - left, Decreased step length - right Gait velocity: decreased     General Gait Details: VCs to roll rather than lift RW, steady, no buckling, no loss of balance   Stairs Stairs: Yes Stairs assistance: Min assist Stair  Management: No rails, Backwards, Forwards, Step to pattern, With walker Number of Stairs: 1 General stair comments: 1 step forwards and then 1 step backwards, pt preferred backwards, wife present and steadied RW, VCs sequencing   Wheelchair Mobility     Tilt Bed    Modified Rankin (Stroke Patients Only)       Balance Overall balance assessment: Needs assistance, History of Falls Sitting-balance support: Feet supported Sitting balance-Leahy Scale: Good     Standing balance support: Bilateral upper extremity supported, During functional activity, Reliant on assistive device for balance Standing balance-Leahy Scale: Fair Standing balance comment: static standing no UE support with wide BOS and no overt LOB                            Communication Communication Communication: No apparent difficulties  Cognition Arousal: Alert Behavior During Therapy: WFL for tasks assessed/performed   PT - Cognitive impairments: No apparent impairments                         Following commands: Intact      Cueing    Exercises Total Joint Exercises Ankle Circles/Pumps: AROM, Both, 10 reps Quad Sets: AROM, Both, 5 reps, Supine Short Arc Quad: AROM, Right, 5 reps, Supine Heel Slides: AAROM, Right, 5 reps, Supine Hip ABduction/ADduction: AROM, Right, 5 reps, Supine Straight Leg Raises: AROM, Right, 5 reps, Supine Long Arc Quad: AROM, Right, 10 reps, Seated Knee Flexion: AAROM, Right, 15 reps, Seated Goniometric ROM: 5-65* AAROM R knee  General Comments        Pertinent Vitals/Pain Pain Assessment Pain Score: 5  Pain Location: R knee Pain Descriptors / Indicators: Sore, Operative site guarding, Aching Pain Intervention(s): Limited activity within patient's tolerance, Monitored during session, Premedicated before session, Repositioned, Ice applied    Home Living                          Prior Function            PT Goals (current goals can  now be found in the care plan section) Acute Rehab PT Goals Patient Stated Goal: to be able to be more active, walk, bike and play golf PT Goal Formulation: With patient/family Time For Goal Achievement: 10/08/24 Potential to Achieve Goals: Good Progress towards PT goals: Goals met/education completed, patient discharged from PT    Frequency    7X/week      PT Plan      Co-evaluation              AM-PAC PT 6 Clicks Mobility   Outcome Measure  Help needed turning from your back to your side while in a flat bed without using bedrails?: None Help needed moving from lying on your back to sitting on the side of a flat bed without using bedrails?: A Little Help needed moving to and from a bed to a chair (including a wheelchair)?: None Help needed standing up from a chair using your arms (e.g., wheelchair or bedside chair)?: None Help needed to walk in hospital room?: None Help needed climbing 3-5 steps with a railing? : A Little 6 Click Score: 22    End of Session Equipment Utilized During Treatment: Gait belt Activity Tolerance: Patient tolerated treatment well Patient left: in chair;with call bell/phone within reach;with family/visitor present Nurse Communication: Mobility status PT Visit Diagnosis: Unsteadiness on feet (R26.81);Other abnormalities of gait and mobility (R26.89);Muscle weakness (generalized) (M62.81);Difficulty in walking, not elsewhere classified (R26.2);Pain Pain - Right/Left: Right Pain - part of body: Knee     Time: 9152-9081 PT Time Calculation (min) (ACUTE ONLY): 31 min  Charges:    $Gait Training: 8-22 mins $Therapeutic Exercise: 8-22 mins PT General Charges $$ ACUTE PT VISIT: 1 Visit                     Sylvan Delon Copp PT 09/25/2024  Acute Rehabilitation Services  Office [phone]

## 2024-09-25 NOTE — Progress Notes (Signed)
 Orthopedic Tech Progress Note Patient Details:  Christian Rojas [birth year] [id number]  Patient ID: [name], male   DOB: [birth year], 78 y.o.   MRN: [id number] Pt. Has  bone foam for discharge. Adine MARLA Blush 09/25/2024, 10:34 AM

## 2024-09-25 NOTE — Progress Notes (Signed)
 Orthopedics Progress Note  Subjective: Pain controlled this AM. Did well with therapy  Objective:  Vitals:   09/25/24 0059 09/25/24 0612  BP: 119/65 (!) 144/71  Pulse: 66 61  Resp: 14 14  Temp: (!) 97.5 F (36.4 C) 98.2 F (36.8 C)  SpO2: 96% 99%    General: Awake and alert  Musculoskeletal: Right knee dressing changed. Bending knee well. Neg Homans Neurovascularly intact  Lab Results  Component Value Date   WBC 6.6 09/20/2024   HGB 14.2 09/20/2024   HCT 44.5 09/20/2024   MCV 94.3 09/20/2024   PLT 174 09/20/2024       Component Value Date/Time   NA 138 09/20/2024 1130   NA 139 09/05/2022 1452   K 4.6 09/20/2024 1130   CL 102 09/20/2024 1130   CO2 25 09/20/2024 1130   GLUCOSE 97 09/20/2024 1130   BUN 12 09/20/2024 1130   BUN 13 09/05/2022 1452   CREATININE 1.06 09/20/2024 1130   CALCIUM  9.3 09/20/2024 1130   GFRNONAA >60 09/20/2024 1130    No results found for: INR, PROTIME  Assessment/Plan: POD #1 s/p Procedure(s): ARTHROPLASTY, KNEE, TOTAL Stable overnight. Home after therapy today. Follow up with therapy on Monday and back with me in the office in two weeks Xarelto restarting this evening TED hose on 24/7 for 30 days  Elspeth SAUNDERS. Kay, MD 09/25/2024 8:06 AM

## 2024-09-25 NOTE — Discharge Summary (Signed)
 In most cases prophylactic antibiotics for Dental procdeures after total joint surgery are not necessary.  Exceptions are as follows:  1. History of prior total joint infection  2. Severely immunocompromised (Organ Transplant, cancer chemotherapy, Rheumatoid biologic meds such as Humera)  3. Poorly controlled diabetes (A1C &gt; 8.0, blood glucose over 200)  If you have one of these conditions, contact your surgeon for an antibiotic prescription, prior to your dental procedure. Orthopedic Discharge Summary        Physician Discharge Summary  Patient ID: Christian Rojas MRN: [id number] DOB/AGE: 1946-02-14 78 y.o.  Admit date: 09/24/2024 Discharge date: 09/25/2024   Procedures:  Procedure(s) (LRB): ARTHROPLASTY, KNEE, TOTAL (Right)  Attending Physician:  Dr. Elspeth Her  Admission Diagnoses:   Right knee end stage OA  Discharge Diagnoses:  same   Past Medical History:  Diagnosis Date   Arthritis    Atrial fibrillation (HCC)    CHF (congestive heart failure) (HCC)    Diastolic   COPD (chronic obstructive pulmonary disease) (HCC)    Coronary artery disease    Dyspnea    Dysrhythmia    A.FIB   Hypertension    Low back pain    Neuromuscular disorder (HCC)    neuropathy BLE   OSA (obstructive sleep apnea)    wears CPAP    PCP: Elaine Garnette BIRCH., MD   Discharged Condition: good  Hospital Course:  Patient underwent the above stated procedure on 09/24/2024. Patient tolerated the procedure well and brought to the recovery room in good condition and subsequently to the floor. Patient had an uncomplicated hospital course and was stable for discharge.   Disposition: Discharge disposition: 01-Home or Self Care      with follow up in 2 weeks    Follow-up Information     Her Kemps, MD. Call in 2 week(s).   Specialty: Orthopedic Surgery Why: please call for follow up appt in two weeks in the office, [phone] Contact information: [address] [place] [address] [phone]                 Dental Antibiotics:  In most cases prophylactic antibiotics for Dental procdeures after total joint surgery are not necessary.  Exceptions are as follows:  1. History of prior total joint infection  2. Severely immunocompromised (Organ Transplant, cancer chemotherapy, Rheumatoid biologic meds such as Humera)  3. Poorly controlled diabetes (A1C &gt; 8.0, blood glucose over 200)  If you have one of these conditions, contact your surgeon for an antibiotic prescription, prior to your dental procedure.  Discharge Instructions     Call MD / Call 911   Complete by: As directed    If you experience chest pain or shortness of breath, CALL 911 and be transported to the hospital emergency room.  If you develope a fever above 101 F, pus (white drainage) or increased drainage or redness at the wound, or calf pain, call your surgeon's office.   Constipation Prevention   Complete by: As directed    Drink plenty of fluids.  Prune juice may be helpful.  You may use a stool softener, such as Colace (over the counter) 100 mg twice a day.  Use MiraLax  (over the counter) for constipation as needed.   Diet - low sodium heart healthy   Complete by: As directed    Increase activity slowly as tolerated   Complete by: As directed    Post-operative opioid taper instructions:   Complete by: As directed  POST-OPERATIVE OPIOID TAPER INSTRUCTIONS: It is important to wean off of your opioid medication as soon as possible. If you do not need pain medication after your surgery it is ok to stop day one. Opioids include: Codeine, Hydrocodone(Norco, Vicodin), Oxycodone (Percocet, oxycontin ) and hydromorphone  amongst others.  Long term and even short term use of opiods can cause: Increased pain response Dependence Constipation Depression Respiratory depression And more.  Withdrawal symptoms can include Flu like symptoms Nausea,  vomiting And more Techniques to manage these symptoms Hydrate well Eat regular healthy meals Stay active Use relaxation techniques(deep breathing, meditating, yoga) Do Not substitute Alcohol to help with tapering If you have been on opioids for less than two weeks and do not have pain than it is ok to stop all together.  Plan to wean off of opioids This plan should start within one week post op of your joint replacement. Maintain the same interval or time between taking each dose and first decrease the dose.  Cut the total daily intake of opioids by one tablet each day Next start to increase the time between doses. The last dose that should be eliminated is the evening dose.          Allergies as of 09/25/2024   No Known Allergies      Medication List     TAKE these medications    atenolol  25 MG tablet Commonly known as: TENORMIN  Take 12.5 mg by mouth daily.   methocarbamol  500 MG tablet Commonly known as: ROBAXIN  Take 1 tablet (500 mg total) by mouth every 6 (six) hours as needed for muscle spasms.   ondansetron  4 MG tablet Commonly known as: Zofran  Take 1 tablet (4 mg total) by mouth every 8 (eight) hours as needed for nausea, vomiting or refractory nausea / vomiting.   oxyCODONE  5 MG immediate release tablet Commonly known as: Roxicodone  Take 1 tablet (5 mg total) by mouth every 6 (six) hours as needed for severe pain (pain score 7-10) or breakthrough pain.   rivaroxaban 20 MG Tabs tablet Commonly known as: XARELTO Take 20 mg by mouth daily with supper.   rosuvastatin  10 MG tablet Commonly known as: CRESTOR  Take 5 mg by mouth daily.   Tiotropium Bromide-Olodaterol 2.5-2.5 MCG/ACT Aers Take 2 puffs by mouth daily.          Signed: [name] 09/25/2024, 8:08 AM  [facility] is now [facility] [address], [place], [place] [address] Phone: [phone] Facebook  Instagram  [employer]

## 2024-09-27 ENCOUNTER — Encounter (HOSPITAL_COMMUNITY): Payer: Self-pay | Admitting: Orthopedic Surgery

## 2024-10-11 ENCOUNTER — Other Ambulatory Visit: Payer: Self-pay

## 2024-10-11 DIAGNOSIS — I2510 Atherosclerotic heart disease of native coronary artery without angina pectoris: Secondary | ICD-10-CM | POA: Insufficient documentation

## 2024-10-11 DIAGNOSIS — I499 Cardiac arrhythmia, unspecified: Secondary | ICD-10-CM | POA: Insufficient documentation

## 2024-10-11 DIAGNOSIS — G709 Myoneural disorder, unspecified: Secondary | ICD-10-CM | POA: Insufficient documentation

## 2024-10-11 DIAGNOSIS — M1990 Unspecified osteoarthritis, unspecified site: Secondary | ICD-10-CM | POA: Insufficient documentation

## 2024-10-11 DIAGNOSIS — I509 Heart failure, unspecified: Secondary | ICD-10-CM | POA: Insufficient documentation

## 2024-10-11 NOTE — Progress Notes (Unsigned)
 " Cardiology Office Note:    Date:  10/12/2024   ID:  Christian Rojas, DOB 1946/10/17, MRN [id number]  PCP:  Elaine Garnette BIRCH., MD  Cardiologist:  Redell Leiter, MD    Referring MD: Elaine Garnette BIRCH., MD    ASSESSMENT:    1. Chronic atrial fibrillation (HCC)   2. Chronic anticoagulation   3. Hypertensive heart disease without heart failure    PLAN:    In order of problems listed above:  Doing well with the atrial fibrillation rate is controlled without suppressant therapy continue anticoagulation transition back to Eliquis  at his request  stable BP at target currently not taking any antihypertensive agents   Next appointment: 6 months at his request   Medication Adjustments/Labs and Tests Ordered: Current medicines are reviewed at length with the patient today.  Concerns regarding medicines are outlined above.  No orders of the defined types were placed in this encounter.  No orders of the defined types were placed in this encounter.    History of Present Illness:    Christian Rojas is a 78 y.o. male with a hx of persistent atrial fibrillation on chronic anticoagulation hypertensive heart disease without heart failure and obstructive sleep apnea last seen 06/12/2023.Echocardiograms at the [facility] most recently November 2022 showed LVH normal ejection fraction moderate mitral annular calcification mild mitral regurgitation aortic valve calcification without stenosis.  Recent labs in June cholesterol 186 triglycerides 123 LDL 111 CMP had a creatinine 0.86 GFR 89 cc/min sodium 142 potassium  Compliance with diet, lifestyle and medications: yes  He had a recent total knee arthroplasty was transition from Eliquis  to Xarelto request to go back to Eliquis  No angina edema shortness of breath palpitation or syncope   Past Medical History:  Diagnosis Date   Arthritis    Atrial fibrillation (HCC)    CHF (congestive heart failure) (HCC)    Diastolic   COPD (chronic  obstructive pulmonary disease) (HCC)    Coronary artery disease    Dyspnea    Dysrhythmia    A.FIB   Hypertension    Hypertensive heart disease 10/01/2021   Low back pain    Neuromuscular disorder (HCC)    neuropathy BLE   OSA (obstructive sleep apnea)    wears CPAP    Current Medications: Active Medications[1]    EKGs/Labs/Other Studies Reviewed:    The following studies were reviewed today:  Cardiac Studies & Procedures   ______________________________________________________________________________________________   STRESS TESTS  MYOCARDIAL PERFUSION IMAGING 04/01/2024  Interpretation Summary   Findings are consistent with ischemia. The study is low risk despite ischemia due to percent of myocardium affected (< 10%).   No ST deviation was noted.   LV perfusion is abnormal. There is evidence of ischemia. There is no evidence of infarction. Defect 1: There is a medium defect with moderate reduction in uptake present in the apical to mid septal location(s) that is reversible. There is normal wall motion in the defect area. Consistent with ischemia.   Left ventricular function is normal. End diastolic cavity size is mildly enlarged. End systolic cavity size is normal.   CT images were obtained for attenuation correction and were examined for the presence of coronary calcium  when appropriate.   Coronary calcium  was present on the attenuation correction CT images. Mild coronary calcifications were present. Coronary calcifications were present in the left anterior descending artery and right coronary artery distribution(s). Aortic atherosclerosis.  Severe mitral annular calcification.  Aortic valve calcification noted.   Prior study not  available for comparison.   ECHOCARDIOGRAM  ECHOCARDIOGRAM COMPLETE 03/31/2024  Narrative ECHOCARDIOGRAM REPORT    Patient Name:   Christian Rojas Date of Exam: 03/31/2024 Medical Rec #:  [id number]     Height:       71.0 in Accession #:     [id number]    Weight:       263.6 lb Date of Birth:  [birth year]     BSA:          2.371 m Patient Age:    77 years      BP:           138/78 mmHg Patient Gender: M             HR:           67 bpm. Exam Location:  [address]  Procedure: 2D Echo, 3D Echo and Strain Analysis (Both Spectral and Color Flow Doppler were utilized during procedure).  Indications:    Z01.818 Preoperative evaluation  History:        Patient has no prior history of Echocardiogram examinations. CHF, COPD, Arrythmias:Atrial Fibrillation, Signs/Symptoms:Dyspnea; Risk Factors:Sleep Apnea, Hypertension and Former Smoker. Lower extremity edema.  Sonographer:    Jon Hacker RCS Referring Phys: [phone] ROBERT J KRASOWSKI  IMPRESSIONS   1. Left ventricular ejection fraction, by estimation, is 60 to 65%. Left ventricular ejection fraction by 3D volume is 65 %. The left ventricle has normal function. The left ventricle has no regional wall motion abnormalities. Left ventricular diastolic function could not be evaluated. The average left ventricular global longitudinal strain is -19.8 %. The global longitudinal strain is normal. 2. Right ventricular systolic function is mildly reduced. The right ventricular size is normal. There is mildly elevated pulmonary artery systolic pressure. The estimated right ventricular systolic pressure is 42.7 mmHg. 3. Left atrial size was mildly dilated. 4. Right atrial size was moderately dilated. 5. The mitral valve is normal in structure. Trivial mitral valve regurgitation. No evidence of mitral stenosis. Moderate mitral annular calcification. 6. The aortic valve is normal in structure. Aortic valve regurgitation is not visualized. No aortic stenosis is present. 7. Aortic dilatation noted. There is mild dilatation of the ascending aorta, measuring 43 mm. 8. The inferior vena cava is normal in size with greater than 50% respiratory variability, suggesting right atrial pressure of 3  mmHg.  FINDINGS Left Ventricle: Left ventricular ejection fraction, by estimation, is 60 to 65%. Left ventricular ejection fraction by 3D volume is 65 %. The left ventricle has normal function. The left ventricle has no regional wall motion abnormalities. The average left ventricular global longitudinal strain is -19.8 %. Strain was performed and the global longitudinal strain is normal. The left ventricular internal cavity size was normal in size. There is no left ventricular hypertrophy. Left ventricular diastolic function could not be evaluated due to atrial fibrillation. Left ventricular diastolic function could not be evaluated.  Right Ventricle: The right ventricular size is normal. No increase in right ventricular wall thickness. Right ventricular systolic function is mildly reduced. There is mildly elevated pulmonary artery systolic pressure. The tricuspid regurgitant velocity is 3.15 m/s, and with an assumed right atrial pressure of 3 mmHg, the estimated right ventricular systolic pressure is 42.7 mmHg.  Left Atrium: Left atrial size was mildly dilated.  Right Atrium: Right atrial size was moderately dilated.  Pericardium: There is no evidence of pericardial effusion.  Mitral Valve: The mitral valve is normal in structure. Moderate mitral annular calcification. Trivial mitral valve regurgitation.  No evidence of mitral valve stenosis.  Tricuspid Valve: The tricuspid valve is normal in structure. Tricuspid valve regurgitation is mild . No evidence of tricuspid stenosis.  Aortic Valve: The aortic valve is normal in structure. Aortic valve regurgitation is not visualized. No aortic stenosis is present.  Pulmonic Valve: The pulmonic valve was normal in structure. Pulmonic valve regurgitation is not visualized. No evidence of pulmonic stenosis.  Aorta: Aortic dilatation noted. There is mild dilatation of the ascending aorta, measuring 43 mm.  Venous: The inferior vena cava is normal in  size with greater than 50% respiratory variability, suggesting right atrial pressure of 3 mmHg.  IAS/Shunts: No atrial level shunt detected by color flow Doppler.  Additional Comments: 3D was performed not requiring image post processing on an independent workstation and was normal.   LEFT VENTRICLE PLAX 2D LVIDd:         4.74 cm LVIDs:         2.78 cm         2D Longitudinal LV PW:         1.23 cm         Strain LV IVS:        1.18 cm         2D Strain GLS   -22.4 % LVOT diam:     2.00 cm         (A4C): LV SV:         71              2D Strain GLS   -19.0 % LV SV Index:   30              (A3C): LVOT Area:     3.14 cm        2D Strain GLS   -18.2 % (A2C): 2D Strain GLS   -19.8 % Avg:  3D Volume EF LV 3D EF:    Left ventricul ar ejection fraction by 3D volume is 65 %.  3D Volume EF: 3D EF:        65 % LV EDV:       163 ml LV ESV:       57 ml LV SV:        105 ml  RIGHT VENTRICLE RV Basal diam:  3.22 cm RV S prime:     12.07 cm/s TAPSE (M-mode): 1.9 cm  LEFT ATRIUM             Index        RIGHT ATRIUM           Index LA diam:        4.70 cm 1.98 cm/m   RA Area:     25.60 cm LA Vol (A2C):   96.5 ml 40.70 ml/m  RA Volume:   70.90 ml  29.90 ml/m LA Vol (A4C):   70.9 ml 29.90 ml/m LA Biplane Vol: 83.5 ml 35.22 ml/m AORTIC VALVE LVOT Vmax:   111.00 cm/s LVOT Vmean:  75.133 cm/s LVOT VTI:    0.226 m  AORTA Ao Root diam: 3.90 cm Ao Asc diam:  4.30 cm  MITRAL VALVE             TRICUSPID VALVE MV Area (PHT): 3.06 cm  TR Peak grad:   39.7 mmHg TR Vmax:        315.00 cm/s  SHUNTS Systemic VTI:  0.23 m Systemic Diam: 2.00 cm  Morene Brownie Electronically signed by Morene Brownie Signature Date/Time:  03/31/2024/1:18:17 PM    Final          ______________________________________________________________________________________________          Recent Labs: 09/20/2024: BUN 12; Creatinine, Ser 1.06; Hemoglobin 14.2; Platelets 174; Potassium  4.6; Sodium 138  Recent Lipid Panel No results found for: CHOL, TRIG, HDL, CHOLHDL, VLDL, LDLCALC, LDLDIRECT  Physical Exam:    VS:  BP 120/68   Pulse 74   Ht 5' 11.5 (1.816 m)   Wt 247 lb (112 kg)   SpO2 99%   BMI 33.97 kg/m     Wt Readings from Last 3 Encounters:  10/12/24 247 lb (112 kg)  09/24/24 257 lb 15 oz (117 kg)  09/20/24 260 lb (117.9 kg)     GEN:  Well nourished, well developed in no acute distress HEENT: Normal NECK: No JVD; No carotid bruits LYMPHATICS: No lymphadenopathy CARDIAC: Irregular rhythm  RESPIRATORY:  Clear to auscultation without rales, wheezing or rhonchi  ABDOMEN: Soft, non-tender, non-distended MUSCULOSKELETAL:  No edema; No deformity  SKIN: Warm and dry NEUROLOGIC:  Alert and oriented x 3 PSYCHIATRIC:  Normal affect    Signed, Redell Leiter, MD  10/12/2024 4:18 PM    [facility]      [1]  Current Meds  Medication Sig   atenolol  (TENORMIN ) 25 MG tablet Take 12.5 mg by mouth daily.   methocarbamol  (ROBAXIN ) 500 MG tablet Take 1 tablet (500 mg total) by mouth every 6 (six) hours as needed for muscle spasms.   ondansetron  (ZOFRAN ) 4 MG tablet Take 1 tablet (4 mg total) by mouth every 8 (eight) hours as needed for nausea, vomiting or refractory nausea / vomiting.   rivaroxaban (XARELTO) 20 MG TABS tablet Take 20 mg by mouth daily with supper. (Patient taking differently: Take 15 mg by mouth daily with supper.)   Tiotropium Bromide-Olodaterol 2.5-2.5 MCG/ACT AERS Take 2 puffs by mouth daily.   "

## 2024-10-12 ENCOUNTER — Encounter: Payer: Self-pay | Admitting: Cardiology

## 2024-10-12 ENCOUNTER — Ambulatory Visit: Attending: Cardiology | Admitting: Cardiology

## 2024-10-12 VITALS — BP 120/68 | HR 74 | Ht 71.5 in | Wt 247.0 lb

## 2024-10-12 DIAGNOSIS — Z7901 Long term (current) use of anticoagulants: Secondary | ICD-10-CM | POA: Diagnosis not present

## 2024-10-12 DIAGNOSIS — I119 Hypertensive heart disease without heart failure: Secondary | ICD-10-CM

## 2024-10-12 DIAGNOSIS — I482 Chronic atrial fibrillation, unspecified: Secondary | ICD-10-CM | POA: Diagnosis not present

## 2024-10-12 MED ORDER — APIXABAN 5 MG PO TABS: 5.0000 mg | ORAL_TABLET | Freq: Two times a day (BID) | ORAL | 3 refills | Status: AC

## 2024-10-12 NOTE — Patient Instructions (Signed)
 Medication Instructions:  Your physician has recommended you make the following change in your medication:   Stop Xarelto  Start Eliquis  5 mg twice daily  *If you need a refill on your cardiac medications before your next appointment, please call your pharmacy*   Lab Work: None ordered If you have labs (blood work) drawn today and your tests are completely normal, you will receive your results only by: MyChart Message (if you have MyChart) OR A paper copy in the mail If you have any lab test that is abnormal or we need to change your treatment, we will call you to review the results.   Testing/Procedures: None ordered   Follow-Up: At [facility], you and your health needs are our priority.  As part of our continuing mission to provide you with exceptional heart care, we have created designated Provider Care Teams.  These Care Teams include your primary Cardiologist (physician) and Advanced Practice Providers (APPs -  Physician Assistants and Nurse Practitioners) who all work together to provide you with the care you need, when you need it.  We recommend signing up for the patient portal called MyChart.  Sign up information is provided on this After Visit Summary.  MyChart is used to connect with patients for Virtual Visits (Telemedicine).  Patients are able to view lab/test results, encounter notes, upcoming appointments, etc.  Non-urgent messages can be sent to your provider as well.   To learn more about what you can do with MyChart, go to [email or web address].    Your next appointment:   6 month(s)  The format for your next appointment:   In Person  Provider:   Redell Leiter, MD    Other Instructions none  Important Information About Sugar

## 2024-10-12 NOTE — Addendum Note (Signed)
 Addended by: ONEITA BERLINER on: 10/12/2024 05:00 PM   Modules accepted: Orders
